# Patient Record
Sex: Female | Born: 1975 | Race: White | Hispanic: No | Marital: Married | Smoking: Never smoker
Health system: Southern US, Community
[De-identification: ages and names within clinical notes are randomized; demographics above are authoritative.]

## PROBLEM LIST (undated history)

## (undated) DIAGNOSIS — F419 Anxiety disorder, unspecified: Secondary | ICD-10-CM

## (undated) HISTORY — PX: TONSILLECTOMY: SUR1361

## (undated) HISTORY — PX: BREAST BIOPSY: SHX20

---

## 2002-07-24 ENCOUNTER — Other Ambulatory Visit: Admission: RE | Admit: 2002-07-24 | Discharge: 2002-07-24 | Payer: Self-pay | Admitting: Obstetrics and Gynecology

## 2003-08-13 ENCOUNTER — Other Ambulatory Visit: Admission: RE | Admit: 2003-08-13 | Discharge: 2003-08-13 | Payer: Self-pay | Admitting: Obstetrics and Gynecology

## 2004-05-22 ENCOUNTER — Inpatient Hospital Stay (HOSPITAL_COMMUNITY): Admission: AD | Admit: 2004-05-22 | Discharge: 2004-06-01 | Payer: Self-pay | Admitting: Obstetrics and Gynecology

## 2004-05-29 ENCOUNTER — Encounter (INDEPENDENT_AMBULATORY_CARE_PROVIDER_SITE_OTHER): Payer: Self-pay | Admitting: Specialist

## 2004-06-02 ENCOUNTER — Encounter: Admission: RE | Admit: 2004-06-02 | Discharge: 2004-07-02 | Payer: Self-pay | Admitting: Obstetrics and Gynecology

## 2004-07-11 ENCOUNTER — Other Ambulatory Visit: Admission: RE | Admit: 2004-07-11 | Discharge: 2004-07-11 | Payer: Self-pay | Admitting: Obstetrics and Gynecology

## 2004-07-11 ENCOUNTER — Encounter: Admission: RE | Admit: 2004-07-11 | Discharge: 2004-08-10 | Payer: Self-pay | Admitting: Obstetrics and Gynecology

## 2005-01-17 ENCOUNTER — Ambulatory Visit: Payer: Self-pay | Admitting: Internal Medicine

## 2005-02-20 ENCOUNTER — Ambulatory Visit: Payer: Self-pay | Admitting: Internal Medicine

## 2005-07-17 ENCOUNTER — Other Ambulatory Visit: Admission: RE | Admit: 2005-07-17 | Discharge: 2005-07-17 | Payer: Self-pay | Admitting: Obstetrics and Gynecology

## 2006-02-23 ENCOUNTER — Ambulatory Visit: Payer: Self-pay | Admitting: Internal Medicine

## 2007-04-30 ENCOUNTER — Inpatient Hospital Stay (HOSPITAL_COMMUNITY): Admission: AD | Admit: 2007-04-30 | Discharge: 2007-05-01 | Payer: Self-pay | Admitting: Obstetrics and Gynecology

## 2007-05-29 ENCOUNTER — Inpatient Hospital Stay (HOSPITAL_COMMUNITY): Admission: AD | Admit: 2007-05-29 | Discharge: 2007-06-02 | Payer: Self-pay | Admitting: Obstetrics & Gynecology

## 2007-06-04 ENCOUNTER — Encounter: Admission: RE | Admit: 2007-06-04 | Discharge: 2007-07-04 | Payer: Self-pay | Admitting: Obstetrics and Gynecology

## 2007-07-05 ENCOUNTER — Encounter: Admission: RE | Admit: 2007-07-05 | Discharge: 2007-07-27 | Payer: Self-pay | Admitting: Obstetrics and Gynecology

## 2008-07-29 ENCOUNTER — Ambulatory Visit: Payer: Self-pay | Admitting: Family Medicine

## 2008-07-29 DIAGNOSIS — J069 Acute upper respiratory infection, unspecified: Secondary | ICD-10-CM | POA: Insufficient documentation

## 2008-07-29 DIAGNOSIS — J309 Allergic rhinitis, unspecified: Secondary | ICD-10-CM | POA: Insufficient documentation

## 2008-09-07 ENCOUNTER — Telehealth: Payer: Self-pay | Admitting: Family Medicine

## 2008-12-24 ENCOUNTER — Ambulatory Visit: Payer: Self-pay | Admitting: Internal Medicine

## 2008-12-31 ENCOUNTER — Ambulatory Visit: Payer: Self-pay | Admitting: Internal Medicine

## 2009-03-10 ENCOUNTER — Ambulatory Visit: Payer: Self-pay | Admitting: Internal Medicine

## 2010-07-26 NOTE — Discharge Summary (Signed)
Mary Martin, Mary Martin             ACCOUNT NO.:  [id number]   MEDICAL RECORD NO.:  [id number]          PATIENT TYPE:  INP   LOCATION:  9124                          FACILITY:  [facility]   PHYSICIAN:  Zenaida Niece, M.D.DATE OF BIRTH:  [birth year]   DATE OF ADMISSION:  05/29/2007  DATE OF DISCHARGE:  06/02/2007                               DISCHARGE SUMMARY   ADMISSION DIAGNOSES:  Intrauterine pregnancy at 38+ weeks with premature  rupture of membranes and previous cesarean section.   DISCHARGE DIAGNOSES:  Intrauterine pregnancy at 38+ weeks with premature  rupture of membranes and previous cesarean section.   PROCEDURES:  On March 19, she had a repeat low-transverse cesarean  section.   HISTORY AND PHYSICAL:  This is a 35 year old white female gravida 2,  para 0-1-0-1 with an EGA of 38+ weeks who presents with complaint of  rupture of membranes at approximately 2100 on March 18.  She had no  contractions.   PRENATAL CARE:  Complicated by prior cesarean section at 32 weeks for  breech presentation.  She was treated with 17 progesterone during this  pregnancy and is scheduled for repeat cesarean section.   PRENATAL LABS:  Blood type is A+ with negative antibody screen, rubella  immune, hepatitis B surface antigen negative, HIV negative, gonorrhea  and chlamydia negative, 1-hour Glucola currently is normal, group B  strep negative, quad screen is normal.   PAST OBSTETRICAL HISTORY:  In 2006, cesarean section at 32-33 weeks for  preterm premature rupture of membranes and breech presentation.  Baby  weighed 4 pounds 14 ounces.   PAST SURGICAL HISTORY:  Cesarean section, wisdom tooth removal, nasal  reconstruction, and tonsillectomy.   PHYSICAL EXAMINATION:  GENERAL:  She is afebrile with stable vital  signs.  HEART:  Fetal heart tracing is reactive.  ABDOMEN:  Gravid with a transverse scar and nontender.  PELVIC:  Cervical exam is deferred, but she is grossly ruptured.   HOSPITAL COURSE:  The patient was admitted on the 19th and Dr.  Senaida Ores took her to the operating room and performed repeat low-  transverse cesarean section.  She delivered a viable female with Apgars  of 8 and 9, weighed 8 pounds 3 ounces.  Estimated blood loss was 650 mL.  Postoperatively, she had no significant complications.  Preoperative  hemoglobin was 11.4, and on postoperative day was 10.2.  On  postoperative day #3, she was felt to be stable enough for discharge  home.  At that time, her incision was healing well and her staples were  removed and Steri-Strips applied.   DISCHARGE INSTRUCTIONS:  Regular diet, pelvic rest, and no strenuous  activity.  Followup is in approximately 2 weeks for an incision check.   MEDICATIONS:  Percocet #40 one to two p.o. q. 4-6 h. p.r.n. pain and  over-the-counter ibuprofen as needed and she is given our discharge  pamphlet.      Zenaida Niece, M.D.  Electronically Signed     TDM/MEDQ  D:  06/16/2007  T:  06/16/2007  Job:  [id number]

## 2010-07-26 NOTE — Op Note (Signed)
Mary Martin, FEW NO.:  [id number]   MEDICAL RECORD NO.:  [id number]          PATIENT TYPE:  INP   LOCATION:  9124                          FACILITY:  [facility]   PHYSICIAN:  Huel Cote, M.D. DATE OF BIRTH:  10/04/1975   DATE OF PROCEDURE:  05/30/2007  DATE OF DISCHARGE:                               OPERATIVE REPORT   PREOPERATIVE DIAGNOSES:  1. Term pregnancy at 38+ weeks.  2. Spontaneous rupture of membranes.  3. Previous C-section.  Desires repeat C-section with declining      vaginal birth after cesarean section.   POSTOPERATIVE DIAGNOSES:  1. Term pregnancy at 38+ weeks.  2. Spontaneous rupture of membranes.  3. Previous C-section.  Desires repeat C-section with declining      vaginal birth after cesarean section.   PROCEDURE:  Repeat low transverse C-section with two-layer closure of  uterus.   SURGEON:  Dr. Huel Cote.   ANESTHESIA:  Spinal.   FINDINGS:  There were normal uterus, tubes and ovaries noted.  There was  a vigorous female infant in vertex presentation.  Apgars were 8 and 9,  weight was 8 pounds 3 ounces.  The placenta was sent to pathology.   ESTIMATED BLOOD LOSS:  650 mL.   URINE OUTPUT:  400 mL clear urine.   IV FLUIDS:  2800 mL LR   PROCEDURE:  The patient was taken to the operating room where spinal  anesthesia was obtained without difficulty.  The patient was then  prepped and draped in normal sterile fashion in dorsal supine position  with a leftward tilt.  A Pfannenstiel's skin incision was then made over  preexisting scar and carried through to the underlying layer of fascia  by sharp dissection and Bovie cautery.  Fascia was then nicked in  midline and the incision was extended laterally with Mayo scissors.  The  inferior aspect was then grasped with Kocher clamps, elevated and  dissected off the underlying rectus muscles.  The superior aspect was  likewise dissected off the rectus muscles.  The rectus muscles  were then  separated in the midline and peritoneal cavity entered bluntly.  The  peritoneal incision was then extended both superiorly and inferiorly  with careful attention to avoid both bowel and bladder.  The Alexis  wound retractor was then placed within the wound and the lower uterine  segment exposed nicely.  The lower uterine segment was then incised in  transverse fashion after the bladder flap pushed away and the cavity  itself entered bluntly.  The infant's head was then delivered  atraumatically, bulb suctioned.  The remainder of the body delivered  without difficulty.  The infant's cord was clamped, cut and the infant  handed to waiting pediatricians.  The placenta was then delivered  expressed manually and the uterus cleared of all clots and debris.  The  uterine incision was then closed in layers, the first a running locked  layer of 0 Vicryl, 0 chromic and the second an imbricating layer of the  same suture.  Good hemostasis at this point was noted and all of the  bladder  flap and all areas were inspected and there was no active  bleeding noted.  The tubes and ovaries were inspected and found to be  normal.  At this point all instruments and sponges were removed and the  Alexis retractor removed from the abdomen.  Again all appeared  hemostatic and the rectus muscles were gaping slightly therefore they  were reapproximated with several interrupted mattress sutures of 0  Vicryl.  The rectus fascia was then closed with 0 Vicryl in a running  fashion and the skin was closed with staples.  Sponge, lap and needle  counts were correct x2 and the patient was taken to the recovery room in  stable condition      Huel Cote, M.D.  Electronically Signed     KR/MEDQ  D:  05/30/2007  T:  05/30/2007  Job:  [id number]

## 2010-07-29 NOTE — Discharge Summary (Signed)
Mary Martin, Mary Martin             ACCOUNT NO.:  [id number]   MEDICAL RECORD NO.:  [id number]          PATIENT TYPE:  INP   LOCATION:  9317                          [facility]ILITY:  [facility]   PHYSICIAN:  Zenaida Niece, M.D.DATE OF BIRTH:  Jan 18, 1976   DATE OF ADMISSION:  05/22/2004  DATE OF DISCHARGE:  06/01/2004                                 DISCHARGE SUMMARY   ADMISSION DIAGNOSES:  1.  Intrauterine pregnancy at 32 weeks.  2.  Pre-term premature rupture of membranes.  3.  Group B strep carrier.  4.  Breech presentation.   DISCHARGE DIAGNOSES:  1.  Intrauterine pregnancy at 32 weeks.  2.  Pre-term premature rupture of membranes.  3.  Group B strep carrier.  4.  Breech presentation.   PROCEDURE:  On May 29, 2004 she had a primary low transverse cesarean  section.   HISTORY OF PRESENT ILLNESS:  This is a 35 year old white female gravida 1,  para 0 with an estimated gestational age of [redacted] weeks, who presents with  complaint of spontaneous rupture of membranes at 0430 on the day of  admission. She had some mild contractions. Prenatal care to that point  complicated by group B strep in her urine and the fact that she conceived on  Clomid.   PAST SURGICAL HISTORY:  Significant for nasal reconstruction, tonsillectomy,  and wisdom teeth.   PRENATAL LABORATORY DATA:  Blood type A positive with negative antibody  screen. RPR non-reactive. Rubella immune. Gonorrhea and Chlamydia negative.  Triple screen normal. One hour Glucola 117 and group B strep is positive in  her urine.   PHYSICAL EXAMINATION:  VITAL SIGNS:  She was afebrile with stable vital  signs. Fetal heart tracing was reactive with contractions every 4 minutes.  ABDOMEN:  Gravid and non-tender.  GENITOURINARY:  Speculum examination confirmed rupture of membranes and  bedside ultrasound revealed breech presentation.   LABORATORY DATA:  Admission white count was 12.7 with a hemoglobin of 12.4,  platelet count of 232,000  and a normal urinalysis.   HOSPITAL COURSE:  The patient was admitted. Placed on Unasyn and given  betamethasone for fetal pulmonary maturation. She was given terbutaline  p.r.n. for the first 24 to 48 hours to give time for fetal pulmonary  maturation. She was continued on Unasyn. On hospital day 4, her Unasyn was  changed to p.o. amoxicillin and Zithromax was added. She continued to be  stable with reactive fetal heart tracing. On the evening of May 28, 2004,  the fetal heart rate baseline was increased slightly. White count had  increased from 12.7 to 16.4. However, this was attributed possibly due to  the steroid injections. On the morning of May 29, 2004, she did have 1  temperature to 100.4 but  that was surrounded by 2 normal temperatures.  Fundus was non-tender and white count was stable at 16.3. She was placed  back on Unasyn and continued on her Zithromax. On the afternoon of May 29, 2004, temperature again rose to 100.2 and she had increasing contractions.  Due to the possibility of chorioamnionitis, now at 33 weeks, we  discussed  possible delivery and the patient agreed to undergo cesarean section, as the  baby was still breech. Thus, on the evening of May 29, 2004, she had a  primary low transverse cesarean section under spinal anesthesia with an  estimated blood loss of 800 cc. The patient had normal anatomy and delivered  a viable female infant with Apgar's of 7 and 8 that weighed 4 pounds 14  ounces and was in the frank breech presentation. Postoperatively, the  patient had no significant complications. She was continued on Unasyn for  approximately 24 hours. Pre-delivery hemoglobin 12.4. Post-delivery 11.5 and  post-delivery white count was 12.1. The patient remained afebrile and on  postoperative day 3, was felt to be stable enough for discharge home. Prior  to discharge, her staples were to be removed and steri-strips applied.   FINAL PATHOLOGY:  Revealed  normal placenta without evidence of  chorioamnionitis.   DISCHARGE INSTRUCTIONS:  1.  Regular diet.  2.  Pelvic rest.  3.  No strenuous activity.  4.  The patient is given our discharge pamphlet.   FOLLOW UP:  In approximately 2 weeks for an incision check.   DISCHARGE MEDICATIONS:  1.  Percocet #40 1 to 2 p.o. q. 4 to 6 hours p.r.n. pain.  2.  Over-the-counter Ibuprofen as needed.      TDM/MEDQ  D:  07/10/2004  T:  07/11/2004  Job:  [id number]

## 2010-07-29 NOTE — Op Note (Signed)
Mary Martin, Mary Martin             ACCOUNT NO.:  [id number]   MEDICAL RECORD NO.:  [id number]          PATIENT TYPE:  INP   LOCATION:  9158                          [facility]ILITY[facility]   PHYSICIAN:  Leighton Roach Meisinger, M.D.DATE OF BIRTH:  Nov 25, 1975   DATE OF PROCEDURE:  05/29/2004  DATE OF DISCHARGE:                                 OPERATIVE REPORT   PREOPERATIVE AND POSTOPERATIVE DIAGNOSES:  Intrauterine pregnancy at 33  weeks, breech presentation, preterm premature rupture of membranes and  chorioamnionitis.   PROCEDURES:  Primary low transverse cesarean section without extensions.   SURGEON:  Zenaida Niece, M.D.   ANESTHESIA:  Spinal.   ESTIMATED BLOOD LOSS:  800 mL.   FINDINGS:  The patient had normal anatomy and delivered a viable female  infant with Apgars of 7 and 8 that weighed 4 pounds 14 ounces and was in the  frank breech presentation.   SPECIMENS:  Placenta to pathology and cultures were done.   PROCEDURE IN DETAIL:  The patient was taken to the operating room and placed  in the sitting position. Dr. Cristela Blue instilled spinal anesthesia and  she was placed in the dorsosupine position with left lateral tilt. Abdomen  was prepped and draped in the usual sterile fashion. A Foley catheter had  previously been placed. The level of her anesthesia was found to be adequate  and abdomen was entered via a standard Pfannenstiel's incision. Bladder  blade was placed and the bladder was pushed inferior. A 4 cm transverse  incision was made in the lower uterine segment. This was extended  bilaterally digitally. The fetal breech was grasped and baby easily  delivered the hips, shoulders, arms and head without difficulty. Mouth and  nares were suctioned and the cord was doubly clamped and cut. The infant was  then handed to the awaiting pediatric team. Cord blood was obtained and the  placenta was manually removed and cultured and sent to pathology. The uterus  was wiped dry  with clean lap pad. Uterine incision was inspected and found  to be free of extensions. Uterine incision was then closed in one layer  being running locking layer with #1 chromic with adequate hemostasis. An  imbricating layer was not placed due to close proximity of the inferior  portion of the incision to the bladder. Bleeding from serosal edges was  controlled with the electrocautery. Tubes and ovaries were inspected and  found to be normal. The subfascial space was then irrigated and made  hemostatic with electrocautery. Fascia was closed in running fashion  starting at both ends and meeting in the middle  with 0 Vicryl. Subcutaneous tissue was then irrigated and made hemostatic  with electrocautery. Skin was closed with staples followed by a sterile  dressing. The patient tolerated the procedure well and was taken to recovery  room in stable condition. Counts were correct x2.      TDM/MEDQ  D:  05/29/2004  T:  05/30/2004  Job:  [id number]

## 2010-12-05 LAB — CBC
HCT: 33.3 — ABNORMAL LOW
Hemoglobin: 11.4 — ABNORMAL LOW
Platelets: 172
RBC: 3.88
RDW: 14
RDW: 14.1
WBC: 10.7 — ABNORMAL HIGH

## 2010-12-05 LAB — RPR: RPR Ser Ql: NONREACTIVE

## 2012-01-22 ENCOUNTER — Ambulatory Visit (INDEPENDENT_AMBULATORY_CARE_PROVIDER_SITE_OTHER): Payer: BC Managed Care – PPO

## 2012-01-22 DIAGNOSIS — Z23 Encounter for immunization: Secondary | ICD-10-CM

## 2012-04-30 ENCOUNTER — Telehealth: Payer: Self-pay | Admitting: Family Medicine

## 2012-04-30 NOTE — Telephone Encounter (Signed)
[facility] Triage Call Report Triage Record Num: [id number] Operator: Roselyn Meier Patient Name: Mary Martin Call Date & Time: 04/29/2012 5:41:25PM Patient Phone: [phone] PCP: Tillman Abide Patient Gender: Female PCP Fax : [phone] Patient DOB: 1975/09/22 Practice Name[facility] Reason for Call: Caller: Diora/Patient; PCP: Tillman Abide (Family Practice); CB#: [phone]; Call regarding Eye redness; Onset 04/29/12 with eye matted (yellow discharge). Pt reports eye is red and is puffy. Pt is afebrile. Triaged patient per Eye: Infection or Irritation Protocol. RX standing orders used for pink eye symptoms. Home care advice and call back parameters given per protocol. RX of Polytrim eye gtts (2 gtts both eyes QID x 5 days) called in to [facility] in [place] [phone] (message left on voicemail) Protocol(s) Used: Eye: Infection or Irritation Recommended Outcome per Protocol: See Provider within 24 hours Override Outcome if Used in Protocol: Provide Home/Self Care RN Reason for Override Outcome: Rx Standing Orders Used. Reason for Outcome: New onset of eye redness, irritation/foreign body sensation or gritty feeling with yellow/green drainage Care Advice: Clean any eye drainage with clean, wet cloth; use warm tap water. Do not use same cloth on unaffected eye. Do not share washcloth with others. ~ 04/29/2012 5:56:25PM Page 1 of 1 [email or web address]

## 2013-04-03 ENCOUNTER — Telehealth: Payer: Self-pay | Admitting: Internal Medicine

## 2013-04-03 MED ORDER — AMOXICILLIN 500 MG PO TABS: 1000.0000 mg | ORAL_TABLET | Freq: Two times a day (BID) | ORAL | Status: DC

## 2013-04-03 NOTE — Telephone Encounter (Signed)
Phone call with mom Daughter had positive strep  She has had bad sore throat Will treat her with amoxil (tends to sinus problems as well)

## 2014-02-11 ENCOUNTER — Ambulatory Visit (INDEPENDENT_AMBULATORY_CARE_PROVIDER_SITE_OTHER): Payer: BC Managed Care – PPO | Admitting: Internal Medicine

## 2014-02-11 ENCOUNTER — Encounter: Payer: Self-pay | Admitting: Internal Medicine

## 2014-02-11 VITALS — BP 104/68 | HR 88 | Temp 98.5°F | Wt 136.8 lb

## 2014-02-11 DIAGNOSIS — J0100 Acute maxillary sinusitis, unspecified: Secondary | ICD-10-CM

## 2014-02-11 DIAGNOSIS — J019 Acute sinusitis, unspecified: Secondary | ICD-10-CM | POA: Insufficient documentation

## 2014-02-11 MED ORDER — AMOXICILLIN 500 MG PO TABS: 1000.0000 mg | ORAL_TABLET | Freq: Two times a day (BID) | ORAL | Status: DC

## 2014-02-11 NOTE — Progress Notes (Signed)
Pre visit review using our clinic review tool, if applicable. No additional management support is needed unless otherwise documented below in the visit note. 

## 2014-02-11 NOTE — Progress Notes (Signed)
   Subjective:    Patient ID: Mary Martin, female    DOB: [birth year], 38 y.o.   MRN: [id number]  HPI Here with daughter She and husband are also sick  Thinks she has a sinus infection Post nasal drip--stuck in throat Head congestion and stuffiness Started 4-5 days ago  No sig cough No fever No SOB No ear pain No sore throat  No current outpatient prescriptions on file prior to visit.   No current facility-administered medications on file prior to visit.    No Known Allergies  No past medical history on file.  No past surgical history on file.  No family history on file.  History   Social History  . Marital Status: Married    Spouse Name: N/A    Number of Children: N/A  . Years of Education: N/A   Occupational History  . Not on file.   Social History Main Topics  . Smoking status: Never Smoker   . Smokeless tobacco: Never Used  . Alcohol Use: No  . Drug Use: No  . Sexual Activity: Not on file   Other Topics Concern  . Not on file   Social History Narrative  . No narrative on file   Review of Systems No rash No N/V Loose stools this weekend---relates to something she ate    Objective:   Physical Exam  Constitutional: She appears well-developed and well-nourished. No distress.  HENT:  Slight pharyngeal injection--no exudate TMs normal Moderate nasal congestion Mild maxillary tenderness  Neck: Normal range of motion. Neck supple. No thyromegaly present.  Pulmonary/Chest: Effort normal and breath sounds normal. No respiratory distress. She has no wheezes. She has no rales.  Lymphadenopathy:    She has no cervical adenopathy.  Skin: No rash noted.          Assessment & Plan:

## 2014-02-11 NOTE — Assessment & Plan Note (Signed)
Sounds like this is just viral Discussed supportive care Daughter with strep also Will give Rx for amoxil in she worsens or throat starts worsening

## 2014-06-24 ENCOUNTER — Ambulatory Visit (INDEPENDENT_AMBULATORY_CARE_PROVIDER_SITE_OTHER): Payer: BC Managed Care – PPO | Admitting: Internal Medicine

## 2014-06-24 ENCOUNTER — Encounter: Payer: Self-pay | Admitting: Internal Medicine

## 2014-06-24 VITALS — BP 102/64 | HR 64 | Temp 98.4°F | Wt 142.2 lb

## 2014-06-24 DIAGNOSIS — Z0000 Encounter for general adult medical examination without abnormal findings: Secondary | ICD-10-CM | POA: Insufficient documentation

## 2014-06-24 DIAGNOSIS — Z23 Encounter for immunization: Secondary | ICD-10-CM

## 2014-06-24 DIAGNOSIS — R5383 Other fatigue: Secondary | ICD-10-CM

## 2014-06-24 LAB — CBC WITH DIFFERENTIAL/PLATELET
BASOS ABS: 0 10*3/uL (ref 0.0–0.1)
Basophils Relative: 0.5 % (ref 0.0–3.0)
EOS PCT: 1.2 % (ref 0.0–5.0)
Eosinophils Absolute: 0.1 10*3/uL (ref 0.0–0.7)
HCT: 40.7 % (ref 36.0–46.0)
HEMOGLOBIN: 13.8 g/dL (ref 12.0–15.0)
LYMPHS PCT: 26.4 % (ref 12.0–46.0)
Lymphs Abs: 2 10*3/uL (ref 0.7–4.0)
MCHC: 33.9 g/dL (ref 30.0–36.0)
MCV: 85.9 fl (ref 78.0–100.0)
MONOS PCT: 5.3 % (ref 3.0–12.0)
Monocytes Absolute: 0.4 10*3/uL (ref 0.1–1.0)
NEUTROS ABS: 4.9 10*3/uL (ref 1.4–7.7)
Neutrophils Relative %: 66.6 % (ref 43.0–77.0)
Platelets: 312 10*3/uL (ref 150.0–400.0)
RBC: 4.74 Mil/uL (ref 3.87–5.11)
RDW: 13.4 % (ref 11.5–15.5)
WBC: 7.4 10*3/uL (ref 4.0–10.5)

## 2014-06-24 LAB — COMPREHENSIVE METABOLIC PANEL
ALK PHOS: 40 U/L (ref 39–117)
ALT: 14 U/L (ref 0–35)
AST: 18 U/L (ref 0–37)
Albumin: 4.2 g/dL (ref 3.5–5.2)
BILIRUBIN TOTAL: 0.7 mg/dL (ref 0.2–1.2)
BUN: 11 mg/dL (ref 6–23)
CO2: 28 meq/L (ref 19–32)
CREATININE: 0.65 mg/dL (ref 0.40–1.20)
Calcium: 9.5 mg/dL (ref 8.4–10.5)
Chloride: 101 mEq/L (ref 96–112)
GFR: 108.43 mL/min (ref >60.00)
GLUCOSE: 85 mg/dL (ref 70–99)
Potassium: 3.9 mEq/L (ref 3.5–5.1)
SODIUM: 136 meq/L (ref 135–145)
TOTAL PROTEIN: 7.6 g/dL (ref 6.0–8.3)

## 2014-06-24 LAB — LIPID PANEL
CHOLESTEROL: 217 mg/dL — AB (ref 0–200)
HDL: 86.6 mg/dL (ref >39.00)
LDL Cholesterol: 113 mg/dL — ABNORMAL HIGH (ref 0–99)
NONHDL: 130.4
TRIGLYCERIDES: 89 mg/dL (ref 0.0–149.0)
Total CHOL/HDL Ratio: 3
VLDL: 17.8 mg/dL (ref 0.0–40.0)

## 2014-06-24 LAB — T4, FREE: Free T4: 0.83 ng/dL (ref 0.60–1.60)

## 2014-06-24 NOTE — Addendum Note (Signed)
Addended by: Sydell AxonLAWS, Amreen Raczkowski C on: 06/24/2014 02:30 PM   Modules accepted: Orders

## 2014-06-24 NOTE — Progress Notes (Signed)
Subjective:    Patient ID: Mary Martin, female    DOB: August 28, 1975, 39 y.o.   MRN: [id number]  HPI Here essentially for physical Concerned about gaining about 15# in the past 2 years No set exercise schedule Does have a fit bit--- keeping track   Feels tired Relates this to working a lot--etc Spends time in car--leaves so early to go to her school (with her kids) Discussed her schedule, etc  Current Outpatient Prescriptions on File Prior to Visit  Medication Sig Dispense Refill  . norgestimate-ethinyl estradiol (ORTHO-CYCLEN,SPRINTEC,PREVIFEM) 0.25-35 MG-MCG tablet Take 1 tablet by mouth daily.     No current facility-administered medications on file prior to visit.    No Known Allergies  No past medical history on file.  No past surgical history on file.  Family History  Problem Relation Age of Onset  . Diabetes Paternal Grandmother   . Heart disease Neg Hx   . Cancer Neg Hx     History   Social History  . Marital Status: Married    Spouse Name: N/A  . Number of Children: 2  . Years of Education: N/A   Occupational History  . Grade school teacher    Social History Main Topics  . Smoking status: Never Smoker   . Smokeless tobacco: Never Used  . Alcohol Use: No  . Drug Use: No  . Sexual Activity: Not on file   Other Topics Concern  . Not on file   Social History Narrative   Review of Systems  Constitutional: Positive for fatigue and unexpected weight change.  HENT: Negative for dental problem and hearing loss.   Eyes: Negative for visual disturbance.       Needs exam  Respiratory: Negative for cough, chest tightness and shortness of breath.        Clears throat a lot  Cardiovascular: Negative for chest pain, palpitations and leg swelling.  Gastrointestinal:       No sig heartburn  Endocrine: Positive for polydipsia and polyuria.       Drinks a lot of water--- so goes a lot  Genitourinary: Positive for frequency. Negative for dysuria.    Musculoskeletal: Negative for joint swelling and arthralgias.  Skin: Negative for rash.  Allergic/Immunologic: Negative for environmental allergies and immunocompromised state.  Neurological: Positive for headaches. Negative for dizziness, syncope and light-headedness.  Hematological: Negative for adenopathy. Does not bruise/bleed easily.  Psychiatric/Behavioral: Negative for sleep disturbance and dysphoric mood.       Does have some premenstrual emotional times       Objective:   Physical Exam  Constitutional: She is oriented to person, place, and time. She appears well-developed and well-nourished. No distress.  HENT:  Head: Normocephalic and atraumatic.  Right Ear: External ear normal.  Left Ear: External ear normal.  Mouth/Throat: Oropharynx is clear and moist. No oropharyngeal exudate.  Eyes: Conjunctivae and EOM are normal. Pupils are equal, round, and reactive to light.  Neck: Normal range of motion. Neck supple. No thyromegaly present.  Cardiovascular: Normal rate, regular rhythm, normal heart sounds and intact distal pulses.  Exam reveals no gallop.   No murmur heard. Pulmonary/Chest: Effort normal and breath sounds normal. No respiratory distress. She has no wheezes. She has no rales.  Abdominal: Soft. There is no tenderness.  Musculoskeletal: She exhibits no edema or tenderness.  Lymphadenopathy:    She has no cervical adenopathy.  Neurological: She is alert and oriented to person, place, and time.  Skin: No rash noted.  No erythema.  Psychiatric: She has a normal mood and affect. Her behavior is normal.          Assessment & Plan:

## 2014-06-24 NOTE — Patient Instructions (Signed)
DASH Eating Plan °DASH stands for "Dietary Approaches to Stop Hypertension." The DASH eating plan is a healthy eating plan that has been shown to reduce high blood pressure (hypertension). Additional health benefits may include reducing the risk of type 2 diabetes mellitus, heart disease, and stroke. The DASH eating plan may also help with weight loss. °WHAT DO I NEED TO KNOW ABOUT THE DASH EATING PLAN? °For the DASH eating plan, you will follow these general guidelines: °· Choose foods with a percent daily value for sodium of less than 5% (as listed on the food label). °· Use salt-free seasonings or herbs instead of table salt or sea salt. °· Check with your health care provider or pharmacist before using salt substitutes. °· Eat lower-sodium products, often labeled as "lower sodium" or "no salt added." °· Eat fresh foods. °· Eat more vegetables, fruits, and low-fat dairy products. °· Choose whole grains. Look for the word "whole" as the first word in the ingredient list. °· Choose fish and skinless chicken or turkey more often than red meat. Limit fish, poultry, and meat to 6 oz (170 g) each day. °· Limit sweets, desserts, sugars, and sugary drinks. °· Choose heart-healthy fats. °· Limit cheese to 1 oz (28 g) per day. °· Eat more home-cooked food and less restaurant, buffet, and fast food. °· Limit fried foods. °· Cook foods using methods other than frying. °· Limit canned vegetables. If you do use them, rinse them well to decrease the sodium. °· When eating at a restaurant, ask that your food be prepared with less salt, or no salt if possible. °WHAT FOODS CAN I EAT? °Seek help from a dietitian for individual calorie needs. °Grains °Whole grain or whole wheat bread. Brown rice. Whole grain or whole wheat pasta. Quinoa, bulgur, and whole grain cereals. Low-sodium cereals. Corn or whole wheat flour tortillas. Whole grain cornbread. Whole grain crackers. Low-sodium crackers. °Vegetables °Fresh or frozen vegetables  (raw, steamed, roasted, or grilled). Low-sodium or reduced-sodium tomato and vegetable juices. Low-sodium or reduced-sodium tomato sauce and paste. Low-sodium or reduced-sodium canned vegetables.  °Fruits °All fresh, canned (in natural juice), or frozen fruits. °Meat and Other Protein Products °Ground beef (85% or leaner), grass-fed beef, or beef trimmed of fat. Skinless chicken or turkey. Ground chicken or turkey. Pork trimmed of fat. All fish and seafood. Eggs. Dried beans, peas, or lentils. Unsalted nuts and seeds. Unsalted canned beans. °Dairy °Low-fat dairy products, such as skim or 1% milk, 2% or reduced-fat cheeses, low-fat ricotta or cottage cheese, or plain low-fat yogurt. Low-sodium or reduced-sodium cheeses. °Fats and Oils °Tub margarines without trans fats. Light or reduced-fat mayonnaise and salad dressings (reduced sodium). Avocado. Safflower, olive, or canola oils. Natural peanut or almond butter. °Other °Unsalted popcorn and pretzels. °The items listed above may not be a complete list of recommended foods or beverages. Contact your dietitian for more options. °WHAT FOODS ARE NOT RECOMMENDED? °Grains °White bread. White pasta. White rice. Refined cornbread. Bagels and croissants. Crackers that contain trans fat. °Vegetables °Creamed or fried vegetables. Vegetables in a cheese sauce. Regular canned vegetables. Regular canned tomato sauce and paste. Regular tomato and vegetable juices. °Fruits °Dried fruits. Canned fruit in light or heavy syrup. Fruit juice. °Meat and Other Protein Products °Fatty cuts of meat. Ribs, chicken wings, bacon, sausage, bologna, salami, chitterlings, fatback, hot dogs, bratwurst, and packaged luncheon meats. Salted nuts and seeds. Canned beans with salt. °Dairy °Whole or 2% milk, cream, half-and-half, and cream cheese. Whole-fat or sweetened yogurt. Full-fat   cheeses or blue cheese. Nondairy creamers and whipped toppings. Processed cheese, cheese spreads, or cheese  curds. °Condiments °Onion and garlic salt, seasoned salt, table salt, and sea salt. Canned and packaged gravies. Worcestershire sauce. Tartar sauce. Barbecue sauce. Teriyaki sauce. Soy sauce, including reduced sodium. Steak sauce. Fish sauce. Oyster sauce. Cocktail sauce. Horseradish. Ketchup and mustard. Meat flavorings and tenderizers. Bouillon cubes. Hot sauce. Tabasco sauce. Marinades. Taco seasonings. Relishes. °Fats and Oils °Butter, stick margarine, lard, shortening, ghee, and bacon fat. Coconut, palm kernel, or palm oils. Regular salad dressings. °Other °Pickles and olives. Salted popcorn and pretzels. °The items listed above may not be a complete list of foods and beverages to avoid. Contact your dietitian for more information. °WHERE CAN I FIND MORE INFORMATION? °National Heart, Lung, and [employer] Institute: www.nhlbi.nih.gov/health/health-topics/topics/dash/ °Document Released: 02/16/2011 Document Revised: 07/14/2013 Document Reviewed: 01/01/2013 °ExitCare® Patient Information ©2015 [employer]. This information is not intended to replace advice given to you by your health care provider. Make sure you discuss any questions you have with your health care provider. ° °

## 2014-06-24 NOTE — Assessment & Plan Note (Signed)
Seems like it is overscheduling, etc that is common Will check labs

## 2014-06-24 NOTE — Assessment & Plan Note (Signed)
Seems to be healthy Discussed fitness Tdap today

## 2014-06-24 NOTE — Progress Notes (Signed)
Pre visit review using our clinic review tool, if applicable. No additional management support is needed unless otherwise documented below in the visit note. 

## 2014-12-25 ENCOUNTER — Encounter: Payer: Self-pay | Admitting: Family Medicine

## 2014-12-25 ENCOUNTER — Ambulatory Visit (INDEPENDENT_AMBULATORY_CARE_PROVIDER_SITE_OTHER): Payer: BC Managed Care – PPO | Admitting: Family Medicine

## 2014-12-25 VITALS — BP 120/74 | HR 88 | Temp 98.1°F | Wt 143.5 lb

## 2014-12-25 DIAGNOSIS — B9789 Other viral agents as the cause of diseases classified elsewhere: Secondary | ICD-10-CM

## 2014-12-25 DIAGNOSIS — J029 Acute pharyngitis, unspecified: Secondary | ICD-10-CM | POA: Diagnosis not present

## 2014-12-25 DIAGNOSIS — J069 Acute upper respiratory infection, unspecified: Secondary | ICD-10-CM | POA: Insufficient documentation

## 2014-12-25 DIAGNOSIS — J028 Acute pharyngitis due to other specified organisms: Secondary | ICD-10-CM

## 2014-12-25 LAB — POCT RAPID STREP A (OFFICE): Rapid Strep A Screen: NEGATIVE

## 2014-12-25 MED ORDER — AMOXICILLIN 500 MG PO CAPS: 500.0000 mg | ORAL_CAPSULE | Freq: Three times a day (TID) | ORAL | Status: DC

## 2014-12-25 NOTE — Progress Notes (Signed)
Pre visit review using our clinic review tool, if applicable. No additional management support is needed unless otherwise documented below in the visit note. 

## 2014-12-25 NOTE — Patient Instructions (Signed)
I think you have an early viral upper respiratory infection  Rest Fluids Salt water gargling -when you can  Tylenol or ibuprofen for pain or fever (helps with fever)  Voice rest helps  This may develop into nasal or chest symptoms   If sore throat worsens and you run fever or develop a rash - fill the px for amoxicillin and start it    Update if not starting to improve in a week or if worsening

## 2014-12-25 NOTE — Progress Notes (Signed)
Subjective:    Patient ID: Mary Martin, female    DOB: [birth year], 39 y.o.   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 Preventative health care 06/24/2014  . Fatigue 06/24/2014   No past medical history on file. No past surgical history on file. Social History  Substance Use Topics  . Smoking status: Never Smoker   . Smokeless tobacco: Never Used  . Alcohol Use: No   Family History  Problem Relation Age of Onset  . Diabetes Paternal Grandmother   . Heart disease Neg Hx   . Cancer Neg Hx    No Known Allergies Current Outpatient Prescriptions on File Prior to Visit  Medication Sig Dispense Refill  . Docusate Calcium (STOOL SOFTENER PO) Take by mouth daily.    . Multiple Vitamin (MULTIVITAMIN) tablet Take 1 tablet by mouth daily.    . norgestimate-ethinyl estradiol (ORTHO-CYCLEN,SPRINTEC,PREVIFEM) 0.25-35 MG-MCG tablet Take 1 tablet by mouth daily.    . Probiotic Product (PROBIOTIC DAILY PO) Take by mouth daily.     No current facility-administered medications on file prior to visit.     Review of Systems Review of Systems  Constitutional: Negative for fever, appetite change, and unexpected weight change.  ENT pos for ST and hoarseness/ neg for nasal symptoms or sinus pain  Eyes: Negative for pain and visual disturbance.  Respiratory: Negative for cough and shortness of breath.  pos for throat clearing/almost cough Cardiovascular: Negative for cp  or palpitations    Gastrointestinal: Negative for nausea, diarrhea and constipation.  Genitourinary: Negative for urgency and frequency.  Skin: Negative for pallor or rash   Neurological: Negative for weakness, light-headedness, numbness and headaches.  Hematological: Negative for adenopathy. Does not bruise/bleed easily.  Psychiatric/Behavioral: Negative for dysphoric mood. The patient is not nervous/anxious.         Objective:   Physical Exam  Constitutional: She appears well-developed and well-nourished. No distress.  HENT:  Head: Normocephalic and atraumatic.  Right Ear: External ear normal.  Left Ear: External ear normal.  Mouth/Throat: Oropharynx is clear and moist. No oropharyngeal exudate.  Nares are boggy No sinus tenderness Clear post nasal drip /mild    Eyes: Conjunctivae and EOM are normal. Pupils are equal, round, and reactive to light. Right eye exhibits no discharge. Left eye exhibits no discharge.  Neck: Normal range of motion. Neck supple.  Cardiovascular: Normal rate and normal heart sounds.   Pulmonary/Chest: Effort normal and breath sounds normal. No respiratory distress. She has no wheezes. She has no rales. She exhibits no tenderness.  Lymphadenopathy:    She has no cervical adenopathy.  Neurological: She is alert.  Skin: Skin is warm and dry. No rash noted.  Psychiatric: She has a normal mood and affect.  Assessment & Plan:   Problem List Items Addressed This Visit      Respiratory   Sore throat (viral)    Suspect very early uri  Disc symptomatic care - see instructions on AVS  Update if not starting to improve in a week or if worsening    Neg RST- but if worsening of ST over the coming days or fever -will fill px for amoxicillin       Other Visit Diagnoses    Sore throat    -  Primary    Relevant Orders    Rapid Strep A (Completed)

## 2014-12-27 NOTE — Assessment & Plan Note (Signed)
Suspect very early uri  Disc symptomatic care - see instructions on AVS  Update if not starting to improve in a week or if worsening    Neg RST- but if worsening of ST over the coming days or fever -will fill px for amoxicillin

## 2015-02-12 ENCOUNTER — Other Ambulatory Visit: Payer: Self-pay | Admitting: Internal Medicine

## 2015-02-12 ENCOUNTER — Ambulatory Visit (INDEPENDENT_AMBULATORY_CARE_PROVIDER_SITE_OTHER): Payer: BC Managed Care – PPO | Admitting: Internal Medicine

## 2015-02-12 ENCOUNTER — Encounter: Payer: Self-pay | Admitting: Internal Medicine

## 2015-02-12 VITALS — BP 118/70 | HR 97 | Temp 98.3°F | Ht 62.0 in | Wt 146.0 lb

## 2015-02-12 DIAGNOSIS — K120 Recurrent oral aphthae: Secondary | ICD-10-CM

## 2015-02-12 DIAGNOSIS — J0100 Acute maxillary sinusitis, unspecified: Secondary | ICD-10-CM | POA: Diagnosis not present

## 2015-02-12 MED ORDER — AMOXICILLIN 500 MG PO CAPS: 500.0000 mg | ORAL_CAPSULE | Freq: Three times a day (TID) | ORAL | Status: DC

## 2015-02-12 MED ORDER — FLUCONAZOLE 150 MG PO TABS: 150.0000 mg | ORAL_TABLET | Freq: Once | ORAL | Status: DC

## 2015-02-12 NOTE — Patient Instructions (Signed)
Plain Mucinex (NOT D) for thick secretions ;force NON dairy fluids .   Nasal cleansing in the shower as discussed with lather of mild shampoo.After 10 seconds wash off lather while  exhaling through nostrils. Make sure that all residual soap is removed to prevent irritation.  Flonase OR Nasacort AQ 1 spray in each nostril twice a day as needed. Use the "crossover" technique into opposite nostril spraying toward opposite ear @ 45 degree angle, not straight up into nostril.  Plain Allegra (NOT D )  160 daily , Loratidine 10 mg , OR Zyrtec 10 mg @ bedtime  as needed for itchy eyes & sneezing.Zicam Melts or Zinc lozenges as per package label for sore throat .  Complementary options to boost immunity include  vitamin C 2000 mg daily; & Echinacea for 4-7 days.  Please take a probiotic , Florastor OR Align, every day if the bowels are loose. This will replace the normal bacteria which  are necessary for formation of normal stool and processing of food.

## 2015-02-12 NOTE — Progress Notes (Signed)
Pre visit review using our clinic review tool, if applicable. No additional management support is needed unless otherwise documented below in the visit note. 

## 2015-02-12 NOTE — Progress Notes (Signed)
   Subjective:    Patient ID: Mary Martin, female    DOB: [birth year], 39 y.o.   MRN: [id number]  HPI Her symptoms began 02/07/15 as head congestion. She also began to develop painful lesions intraorally. This is an intermittent phenomena.She has maxillary sinus area pain and green nasal discharge. Cough is minor and productive of some white sputum.  On 11/30 and 12/2 she's had loose stool. She felt feverish today.  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 She has a 38-year-old 38 year old.    Review of Systems Frontal headache, , dental pain, sore throat , otic pain or otic discharge denied. No chills or sweats. Extrinsic symptoms of itchy, watery eyes, sneezing, or angioedema are denied. There is no  Wheezing or  paroxysmal nocturnal dyspnea.     Objective:   Physical Exam  Pertinent or positive findings include: She has 3 aphthous ulcers intraorally. There is mild nasal erythema. General appearance:Adequately nourished; no acute distress or increased work of breathing is present.    Lymphatic: No  lymphadenopathy about the head, neck, or axilla .  Eyes: No conjunctival inflammation or lid edema is present. There is no scleral icterus.  Ears:  External ear exam shows no significant lesions or deformities.  Otoscopic examination reveals clear canals, tympanic membranes are intact bilaterally without bulging, retraction, inflammation or discharge.  Nose:  External nasal examination shows no deformity or inflammation. No septal dislocation or deviation.No obstruction to airflow.   Oral exam: Dental hygiene is good; lips and gums are healthy appearing.There is no oropharyngeal erythema or exudate .  Neck:  No deformities, thyromegaly, masses, or tenderness noted.   Supple with full range of motion without pain.   Heart:  Normal rate and regular rhythm. S1 and S2 normal without gallop, murmur, click, rub or other extra sounds.   Lungs:Chest clear to auscultation; no wheezes,  rhonchi,rales ,or rubs present.  Extremities:  No cyanosis, edema, or clubbing  noted    Skin: Warm & dry w/o tenting or jaundice. No significant lesions or rash.     Assessment & Plan:  #1 rhinosinusitis without significant bronchitis  Plan: Nasal hygiene interventions discussed. See prescription medications

## 2016-07-26 ENCOUNTER — Encounter: Payer: Self-pay | Admitting: Family Medicine

## 2016-07-26 ENCOUNTER — Ambulatory Visit (INDEPENDENT_AMBULATORY_CARE_PROVIDER_SITE_OTHER): Payer: BC Managed Care – PPO | Admitting: Family Medicine

## 2016-07-26 DIAGNOSIS — J069 Acute upper respiratory infection, unspecified: Secondary | ICD-10-CM | POA: Diagnosis not present

## 2016-07-26 MED ORDER — FLUTICASONE PROPIONATE 50 MCG/ACT NA SUSP: 2.0000 | Freq: Every day | NASAL | 6 refills | Status: DC

## 2016-07-26 MED ORDER — FLUCONAZOLE 150 MG PO TABS: 150.0000 mg | ORAL_TABLET | Freq: Once | ORAL | 0 refills | Status: AC

## 2016-07-26 MED ORDER — AMOXICILLIN-POT CLAVULANATE 875-125 MG PO TABS: 1.0000 | ORAL_TABLET | Freq: Two times a day (BID) | ORAL | 0 refills | Status: DC

## 2016-07-26 MED ORDER — BENZONATATE 200 MG PO CAPS: 200.0000 mg | ORAL_CAPSULE | Freq: Two times a day (BID) | ORAL | 0 refills | Status: DC | PRN

## 2016-07-26 NOTE — Assessment & Plan Note (Signed)
Symptoms most consistent with viral URI. Nothing focal to indicate a bacterial illness and duration argues against bacterial sinusitis. Discussed this with the patient. Advised on supportive care with oral antihistamines and Flonase. Tessalon for cough. Given history of sinus infections and high did provide her with a paper prescription for an antibiotic to fill in 2-3 days if her symptoms do not improve. Advised that if her symptoms worsen or she develops any of the return precautions she be evaluated prior to filling the antibiotic. She is provided with a Diflucan prescription as well if she were to fill the antibiotic. She's given return precautions.

## 2016-07-26 NOTE — Progress Notes (Signed)
  Marikay AlarEric Sharry Beining, MD Phone: [phone]  Mary ApaLeslie M Martin is a 41 y.o. female who presents today for same-day visit.  Patient reports onset of symptoms 5 days ago. Has had some sore throat and postnasal drip with sinus congestion. She is not blowing much out of her nose and when she does it is clear or slightly blood-tinged. She notes spitting up minimal blood-tinged mucus in the mornings though not during the day.. She started coughing yesterday that was nonproductive. No shortness of breath. Some postnasal drip. No fevers. Notes her frontal teeth and maxillary sinuses hurt. She does report she is prone to sinus infections. She tried an antihistamine over-the-counter and NyQuil.  ROS see history of present illness  Objective  Physical Exam Vitals:   07/26/16 1312  BP: 120/74  Pulse: 86  Temp: 99 F (37.2 C)    BP Readings from Last 3 Encounters:  07/26/16 120/74  02/12/15 118/70  12/25/14 120/74   Wt Readings from Last 3 Encounters:  07/26/16 143 lb 12.8 oz (65.2 kg)  02/12/15 146 lb (66.2 kg)  12/25/14 143 lb 8 oz (65.1 kg)    Physical Exam  Constitutional: No distress.  HENT:  Head: Normocephalic and atraumatic.  Mouth/Throat: Oropharynx is clear and moist. No oropharyngeal exudate.  Normal TMs, mild tenderness of maxillary sinuses bilaterally  Eyes: Conjunctivae are normal. Pupils are equal, round, and reactive to light.  Neck: Neck supple.  Cardiovascular: Normal rate, regular rhythm and normal heart sounds.   Pulmonary/Chest: Effort normal and breath sounds normal.  Lymphadenopathy:    She has no cervical adenopathy.  Neurological: She is alert. Gait normal.  Skin: Skin is warm and dry. She is not diaphoretic.     Assessment/Plan: Please see individual problem list.  Viral upper respiratory infection Symptoms most consistent with viral URI. Nothing focal to indicate a bacterial illness and duration argues against bacterial sinusitis. Discussed this with the  patient. Advised on supportive care with oral antihistamines and Flonase. Tessalon for cough. Given history of sinus infections and high did provide her with a paper prescription for an antibiotic to fill in 2-3 days if her symptoms do not improve. Advised that if her symptoms worsen or she develops any of the return precautions she be evaluated prior to filling the antibiotic. She is provided with a Diflucan prescription as well if she were to fill the antibiotic. She's given return precautions.   No orders of the defined types were placed in this encounter.   Meds ordered this encounter  Medications  . fluticasone (FLONASE) 50 MCG/ACT nasal spray    Sig: Place 2 sprays into both nostrils daily.    Dispense:  16 g    Refill:  6  . fluconazole (DIFLUCAN) 150 MG tablet    Sig: Take 1 tablet (150 mg total) by mouth once.    Dispense:  1 tablet    Refill:  0  . amoxicillin-clavulanate (AUGMENTIN) 875-125 MG tablet    Sig: Take 1 tablet by mouth 2 (two) times daily.    Dispense:  14 tablet    Refill:  0  . benzonatate (TESSALON) 200 MG capsule    Sig: Take 1 capsule (200 mg total) by mouth 2 (two) times daily as needed for cough.    Dispense:  20 capsule    Refill:  0    Marikay AlarEric Denielle Bayard, MD [facility]

## 2016-07-26 NOTE — Patient Instructions (Signed)
Nice to see you. Your symptoms are likely related to a viral illness at this point. You would benefit from symptomatic treatment with your oral antihistamine and Flonase. You may take Tylenol or ibuprofen for any discomfort. I would watch this for the next 2-3 days and if you are not improving by that time you can fill the antibiotic. If you worsen or you develop trouble breathing or fevers or cough productive of blood I recommend evaluation prior to filling the antibiotic.

## 2016-08-10 ENCOUNTER — Encounter: Payer: Self-pay | Admitting: Physician Assistant

## 2016-08-10 ENCOUNTER — Ambulatory Visit (INDEPENDENT_AMBULATORY_CARE_PROVIDER_SITE_OTHER): Payer: BC Managed Care – PPO | Admitting: Physician Assistant

## 2016-08-10 VITALS — BP 120/80 | HR 76 | Temp 98.7°F | Ht 62.0 in | Wt 144.4 lb

## 2016-08-10 DIAGNOSIS — N3090 Cystitis, unspecified without hematuria: Secondary | ICD-10-CM | POA: Diagnosis not present

## 2016-08-10 LAB — POCT URINALYSIS DIPSTICK
Bilirubin, UA: NEGATIVE
Blood, UA: 200
Glucose, UA: NEGATIVE
KETONES UA: NEGATIVE
Nitrite, UA: NEGATIVE
PH UA: 6 (ref 5.0–8.0)
PROTEIN UA: NEGATIVE
Spec Grav, UA: 1.01 (ref 1.010–1.025)
UROBILINOGEN UA: 0.2 U/dL

## 2016-08-10 MED ORDER — FLUCONAZOLE 150 MG PO TABS: 150.0000 mg | ORAL_TABLET | Freq: Once | ORAL | 0 refills | Status: AC

## 2016-08-10 MED ORDER — CIPROFLOXACIN HCL 500 MG PO TABS: 500.0000 mg | ORAL_TABLET | Freq: Two times a day (BID) | ORAL | 0 refills | Status: DC

## 2016-08-10 NOTE — Patient Instructions (Addendum)
Start the antibiotic, Ciprofloxacin today.  Push fluids, and stay hydrated.  If you develop fever, chills, or worsening back pain, please follow-up with PCP or us.

## 2016-08-10 NOTE — Progress Notes (Signed)
Mary Martin is a 41 y.o. female here for  I acted as a Neurosurgeon for [employer], PA-C Corky Mull, LPN  History of Present Illness:   Chief Complaint  Patient presents with  . Urinary Frequency  . Dysuria    Dysuria   This is a new problem. Episode onset: started on Monday. The problem occurs every urination. The problem has been gradually worsening. The quality of the pain is described as burning and aching. The pain is at a severity of 6/10. The pain is moderate. There has been no fever. She is sexually active. There is no history of pyelonephritis. Associated symptoms include chills, flank pain, frequency, hematuria, hesitancy and urgency. Pertinent negatives include no discharge, nausea, possible pregnancy, sweats or vomiting. Associated symptoms comments: Left flank pain last night, none today.. She has tried NSAIDs (Took Aleve last night) for the symptoms. The treatment provided no relief. There is no history of kidney stones or recurrent UTIs.   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 She also has a history of antibiotic-induced yeast infections.  PMHx, SurgHx, SocialHx, Medications, and Allergies were reviewed in the Visit Navigator and updated as appropriate.  Current Medications:   Current Outpatient Prescriptions:  .  fexofenadine (ALLEGRA) 180 MG tablet, Take by mouth., Disp: , Rfl:  .  naproxen sodium (ALEVE) 220 MG tablet, Take 220 mg by mouth as needed., Disp: , Rfl:  .  norgestimate-ethinyl estradiol (ORTHO-CYCLEN,SPRINTEC,PREVIFEM) 0.25-35 MG-MCG tablet, Take 1 tablet by mouth daily., Disp: , Rfl:  .  benzonatate (TESSALON) 200 MG capsule, Take 1 capsule (200 mg total) by mouth 2 (two) times daily as needed for cough. (Patient not taking: Reported on 08/10/2016), Disp: 20 capsule, Rfl: 0 .  ciprofloxacin (CIPRO) 500 MG tablet, Take 1 tablet (500 mg total) by mouth 2 (two) times daily., Disp: 14 tablet, Rfl:  0 .  fluconazole (DIFLUCAN) 150 MG tablet, Take 1 tablet (150 mg total) by mouth once., Disp: 1 tablet, Rfl: 0 .  fluticasone (FLONASE) 50 MCG/ACT nasal spray, Place 2 sprays into both nostrils daily. (Patient not taking: Reported on 08/10/2016), Disp: 16 g, Rfl: 6   Review of Systems:   Review of Systems  Constitutional: Positive for chills.  Gastrointestinal: Negative for nausea and vomiting.  Genitourinary: Positive for dysuria, flank pain, frequency, hematuria, hesitancy and urgency.    Vitals:   Vitals:   08/10/16 1544  BP: 120/80  Pulse: 76  Temp: 98.7 F (37.1 C)  TempSrc: Oral  SpO2: 100%  Weight: 144 lb 6.1 oz (65.5 kg)  Height: 5\' 2"  (1.575 m)     Body mass index is 26.41 kg/m.  Physical Exam:   Physical Exam  Constitutional: She appears well-developed. She is cooperative.  Non-toxic appearance. She does not have a sickly appearance. She does not appear ill. No distress.  Cardiovascular: Normal rate, regular rhythm, S1 normal, S2 normal, normal heart sounds and normal pulses.   No LE edema  Pulmonary/Chest: Effort normal and breath sounds normal.  Abdominal: Bowel sounds are normal. There is tenderness in the suprapubic area. There is CVA tenderness (mild on L side).  Neurological: She is alert.  Nursing note and vitals reviewed.   Results for orders placed or performed in visit on 08/10/16  POCT urinalysis dipstick  Result Value Ref Range   Color, UA Yellow    Clarity, UA Clear    Glucose, UA Negative  Bilirubin, UA Negative    Ketones, UA Negative    Spec Grav, UA 1.010 1.010 - 1.025   Blood, UA 200 Ery/uL    pH, UA 6.0 5.0 - 8.0   Protein, UA Negative    Urobilinogen, UA 0.2 0.2 or 1.0 E.U./dL   Nitrite, UA Negative    Leukocytes, UA Moderate (2+) (A) Negative    Assessment and Plan:    Verlon AuLeslie was seen today for urinary frequency and dysuria.  Diagnoses and all orders for this visit:  Cystitis -     POCT urinalysis dipstick -     CBC with  Differential/Platelet -     Comprehensive metabolic panel  Frequency of urination -     POCT urinalysis dipstick  Other orders -     ciprofloxacin (CIPRO) 500 MG tablet; Take 1 tablet (500 mg total) by mouth 2 (two) times daily. -     fluconazole (DIFLUCAN) 150 MG tablet; Take 1 tablet (150 mg total) by mouth once.   UA and symptoms consistent with cystitis. I will obtain labs -- CMP and CBC to assess kidney function and infection count. Will treat with cipro to cover for early pyelo. I will also send urine out for culture. I advised patient to follow-up with us if any changes in her symptoms or lack of improvement despite treatment. I also provided 1 diflucan for her at completion of antibiotic if needed for yeast infection. Patient verbalized understanding.   . Reviewed expectations re: course of current medical issues. . Discussed self-management of symptoms. . Outlined signs and symptoms indicating need for more acute intervention. . Patient verbalized understanding and all questions were answered. . See orders for this visit as documented in the electronic medical record. . Patient received an After-Visit Summary.  CMA or LPN served as scribe during this visit. History, Physical, and Plan performed by medical provider. Documentation and orders reviewed and attested to.  Jarold MottoSamantha Jolly Bleicher, PA-C

## 2016-08-11 ENCOUNTER — Telehealth: Payer: Self-pay | Admitting: Physician Assistant

## 2016-08-11 LAB — COMPREHENSIVE METABOLIC PANEL
ALT: 10 U/L (ref 0–35)
AST: 17 U/L (ref 0–37)
Albumin: 4.2 g/dL (ref 3.5–5.2)
Alkaline Phosphatase: 42 U/L (ref 39–117)
BUN: 16 mg/dL (ref 6–23)
CALCIUM: 9.3 mg/dL (ref 8.4–10.5)
CHLORIDE: 101 meq/L (ref 96–112)
CO2: 27 meq/L (ref 19–32)
Creatinine, Ser: 0.73 mg/dL (ref 0.40–1.20)
GFR: 93.79 mL/min (ref >60.00)
Glucose, Bld: 96 mg/dL (ref 70–99)
POTASSIUM: 4.6 meq/L (ref 3.5–5.1)
Sodium: 136 mEq/L (ref 135–145)
Total Bilirubin: 0.5 mg/dL (ref 0.2–1.2)
Total Protein: 7.4 g/dL (ref 6.0–8.3)

## 2016-08-11 LAB — CBC WITH DIFFERENTIAL/PLATELET
BASOS PCT: 1.2 % (ref 0.0–3.0)
Basophils Absolute: 0.1 10*3/uL (ref 0.0–0.1)
EOS ABS: 0.1 10*3/uL (ref 0.0–0.7)
EOS PCT: 0.5 % (ref 0.0–5.0)
HEMATOCRIT: 40.1 % (ref 36.0–46.0)
Hemoglobin: 13.3 g/dL (ref 12.0–15.0)
LYMPHS PCT: 19.7 % (ref 12.0–46.0)
Lymphs Abs: 2.2 10*3/uL (ref 0.7–4.0)
MCHC: 33.2 g/dL (ref 30.0–36.0)
MCV: 88.1 fl (ref 78.0–100.0)
Monocytes Absolute: 0.7 10*3/uL (ref 0.1–1.0)
Monocytes Relative: 6.5 % (ref 3.0–12.0)
NEUTROS ABS: 8 10*3/uL — AB (ref 1.4–7.7)
Neutrophils Relative %: 72.1 % (ref 43.0–77.0)
PLATELETS: 303 10*3/uL (ref 150.0–400.0)
RBC: 4.56 Mil/uL (ref 3.87–5.11)
RDW: 12.8 % (ref 11.5–15.5)
WBC: 11.1 10*3/uL — ABNORMAL HIGH (ref 4.0–10.5)

## 2016-08-11 NOTE — Telephone Encounter (Signed)
Patient called inquiring about lab results, [facility] to clinically disclose this information.

## 2016-08-11 NOTE — Telephone Encounter (Signed)
Patient provided the below information, verbalized understanding and no further questions:  Mary Martin,   I have received your labwork back. Your electrolytes, kidney and liver function look normal. Your white blood cell count (WBC count) is slightly elevated and is likely from your bladder infection. Please follow-up with us if you aren't feeling better or have any additional concerns.   I will contact you once I get your urine culture results -- likely won't be until early next week.   Take care,  Mary Martin

## 2016-08-13 LAB — URINE CULTURE

## 2016-11-08 ENCOUNTER — Other Ambulatory Visit: Payer: Self-pay | Admitting: Obstetrics and Gynecology

## 2016-11-08 DIAGNOSIS — R928 Other abnormal and inconclusive findings on diagnostic imaging of breast: Secondary | ICD-10-CM

## 2016-11-16 ENCOUNTER — Ambulatory Visit
Admission: RE | Admit: 2016-11-16 | Discharge: 2016-11-16 | Disposition: A | Discharge status: Home / Self Care | Payer: BC Managed Care – PPO | Source: Ambulatory Visit | Attending: Obstetrics and Gynecology | Admitting: Obstetrics and Gynecology

## 2016-11-16 ENCOUNTER — Other Ambulatory Visit: Payer: Self-pay | Admitting: Obstetrics and Gynecology

## 2016-11-16 DIAGNOSIS — R928 Other abnormal and inconclusive findings on diagnostic imaging of breast: Secondary | ICD-10-CM

## 2016-11-16 DIAGNOSIS — N6320 Unspecified lump in the left breast, unspecified quadrant: Secondary | ICD-10-CM

## 2016-11-21 ENCOUNTER — Other Ambulatory Visit: Payer: Self-pay | Admitting: Obstetrics and Gynecology

## 2016-11-21 ENCOUNTER — Ambulatory Visit
Admission: RE | Admit: 2016-11-21 | Discharge: 2016-11-21 | Disposition: A | Discharge status: Home / Self Care | Payer: BC Managed Care – PPO | Source: Ambulatory Visit | Attending: Obstetrics and Gynecology | Admitting: Obstetrics and Gynecology

## 2016-11-21 DIAGNOSIS — N6320 Unspecified lump in the left breast, unspecified quadrant: Secondary | ICD-10-CM

## 2017-01-02 ENCOUNTER — Ambulatory Visit: Payer: Self-pay | Admitting: *Deleted

## 2017-01-02 NOTE — Telephone Encounter (Signed)
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 She states she does not abuse her doctor by asking for medications but she would like to be treated for sinus infection. She is requesting Augmentin and Diflucan for after. Please contact her on her cell number- [phone].  Reason for Disposition . [1] Sinus congestion as part of a cold AND [2] present < 10 days  Answer Assessment - Initial Assessment Questions 1. LOCATION: "Where does it hurt?"      Pressure in face 2. ONSET: "When did the sinus pain start?"  (e.g., hours, days)      3-4 days 3. SEVERITY: "How bad is the pain?"   (Scale 1-10; mild, moderate or severe)   - MILD (1-3): doesn't interfere with normal activities    - MODERATE (4-7): interferes with normal activities (e.g., work or school) or awakens from sleep   - SEVERE (8-10): excruciating pain and patient unable to do any normal activities        Not asked- patient is giving symptoms and in a hurry- she is presently working as a Runner, broadcasting/film/videoteacher 4. RECURRENT SYMPTOM: "Have you ever had sinus problems before?" If so, ask: "When was the last time?" and "What happened that time?"      does have history of sinus infection in the past 5. NASAL CONGESTION: "Is the nose blocked?" If so, ask, "Can you open it or must you breathe through the mouth?"     Nasal drainage with sore throat- states ears are fine 6. NASAL DISCHARGE: "Do you have discharge from your nose?" If so ask, "What color?"     green 7. FEVER: "Do you have a fever?" If so, ask: "What is it, how was it measured, and when did it start?"      No fever 8. OTHER SYMPTOMS: "Do you have any other symptoms?" (e.g., sore throat, cough, earache, difficulty breathing)     Sore throat 9. PREGNANCY: "Is there any chance you are pregnant?" "When was your last menstrual period?"     Not asked  Protocols used: SINUS PAIN OR CONGESTION-A-AH

## 2017-01-03 ENCOUNTER — Telehealth: Payer: Self-pay

## 2017-01-03 NOTE — Telephone Encounter (Addendum)
Left message to call office. Nurse Triage to relay message when pt calls

## 2017-01-03 NOTE — Telephone Encounter (Signed)
Received call from pt and information provided by Dr. Alphonsus SiasLetvak explained to the patient regarding treatment for sinus infection. Pt states she has been treated in the past for sinus infections with antibiotics. Advised pt to call doctor's office back if no improvement seen within a week, so that treatment plan could be re-evaluated.

## 2017-01-03 NOTE — Telephone Encounter (Signed)
Patient called to request diflucan because she is on antibiotic and fears she will get yeast infection.

## 2017-01-03 NOTE — Telephone Encounter (Signed)
Copied from CRM [id number]. Topic: Quick Communication - See Telephone Encounter >> Jan 03, 2017  2:33 PM Guinevere FerrariMorris, Angellica Maddison E, NT wrote: CRM for notification. See Telephone encounter for:  01/03/17. Pt. Called in and wanted to know if the doctor would call in Augmentin and Diflucan because every time she takes an antibiotic she gets a yeast infection. [facility] [address] in [place].

## 2017-01-03 NOTE — Telephone Encounter (Signed)
Please let her know that sinus infections are most often viral (though occasionally are complicated later by bacterial infections) It is not considered appropriate to treat sinus infections with antibiotics early in the course since they will usually get better on their own (this is called antibiotic stewardship and is designed to prevent side effects and resistance of bacteria to our available antibiotics)

## 2017-01-04 MED ORDER — FLUCONAZOLE 150 MG PO TABS: ORAL_TABLET | ORAL | 0 refills | Status: DC

## 2017-01-04 NOTE — Telephone Encounter (Signed)
Left message on vm per DPR that rx was sent in.

## 2017-01-04 NOTE — Telephone Encounter (Signed)
Okay to send fluconazole 150mg  #2 x 0 Take once---repeat in 1 week prn

## 2017-05-03 ENCOUNTER — Other Ambulatory Visit: Payer: Self-pay | Admitting: Internal Medicine

## 2017-05-03 MED ORDER — OSELTAMIVIR PHOSPHATE 75 MG PO CAPS: 75.0000 mg | ORAL_CAPSULE | Freq: Every day | ORAL | 0 refills | Status: DC

## 2017-05-25 ENCOUNTER — Encounter: Payer: Self-pay | Admitting: Internal Medicine

## 2017-05-25 ENCOUNTER — Ambulatory Visit: Payer: BC Managed Care – PPO | Admitting: Internal Medicine

## 2017-05-25 VITALS — BP 122/84 | HR 94 | Temp 98.6°F | Wt 152.0 lb

## 2017-05-25 DIAGNOSIS — J0100 Acute maxillary sinusitis, unspecified: Secondary | ICD-10-CM | POA: Diagnosis not present

## 2017-05-25 MED ORDER — AMOXICILLIN 500 MG PO TABS: 1000.0000 mg | ORAL_TABLET | Freq: Two times a day (BID) | ORAL | 0 refills | Status: AC

## 2017-05-25 NOTE — Assessment & Plan Note (Signed)
Her usual symptoms that always progress Discussed symptomatic care Will Rx with amoxil

## 2017-05-25 NOTE — Progress Notes (Signed)
   Subjective:    Patient ID: Mary Martin, female    DOB: February 22, 1976, 42 y.o.   MRN: [id number]  HPI Here due to respiratory infection  Started 4 days with sore throat Very tired Entire mouth hurts today Left ear is clogges---hearing is off (not really painful) Rings under eyes  No fever Some chills Cough with some green sputum (not constant) Not SOB  Using mucinex DM, night and day cold meds Taking allergy meds Some of the nasal spray--but doesn't like it Tylenol  Current Outpatient Medications on File Prior to Visit  Medication Sig Dispense Refill  . fexofenadine (ALLEGRA) 180 MG tablet Take by mouth.    . fluticasone (FLONASE) 50 MCG/ACT nasal spray Place 2 sprays into both nostrils daily. 16 g 6  . naproxen sodium (ALEVE) 220 MG tablet Take 220 mg by mouth as needed.    . norgestimate-ethinyl estradiol (ORTHO-CYCLEN,SPRINTEC,PREVIFEM) 0.25-35 MG-MCG tablet Take 1 tablet by mouth daily.     No current facility-administered medications on file prior to visit.     No Known Allergies  History reviewed. No pertinent past medical history.  History reviewed. No pertinent surgical history.  Family History  Problem Relation Age of Onset  . Diabetes Paternal Grandmother   . Heart disease Neg Hx   . Cancer Neg Hx     Social History   Socioeconomic History  . Marital status: Married    Spouse name: Not on file  . Number of children: 2  . Years of education: Not on file  . Highest education level: Not on file  Social Needs  . Financial resource strain: Not on file  . Food insecurity - worry: Not on file  . Food insecurity - inability: Not on file  . Transportation needs - medical: Not on file  . Transportation needs - non-medical: Not on file  Occupational History  . Occupation: Grade school teacher  Tobacco Use  . Smoking status: Never Smoker  . Smokeless tobacco: Never Used  Substance and Sexual Activity  . Alcohol use: No    Alcohol/week: 0.0 oz  .  Drug use: No  . Sexual activity: Not on file  Other Topics Concern  . Not on file  Social History Narrative  . Not on file   Review of Systems No vomiting or diarrhea No headaches Appetite not too bad Some spotting after her cycle--no missed pills (will check pregnancy test &/or check with gyn)    Objective:   Physical Exam  Constitutional: No distress.  HENT:  Mouth/Throat: Oropharynx is clear and moist. No oropharyngeal exudate.  Mild maxillary tenderness TMs normal Mild nasal inflammation  Neck: No thyromegaly present.  Pulmonary/Chest: Effort normal and breath sounds normal. No respiratory distress. She has no wheezes. She has no rales.  Lymphadenopathy:    She has no cervical adenopathy.          Assessment & Plan:

## 2017-10-01 ENCOUNTER — Other Ambulatory Visit: Payer: Self-pay | Admitting: Obstetrics and Gynecology

## 2017-10-01 DIAGNOSIS — Z1231 Encounter for screening mammogram for malignant neoplasm of breast: Secondary | ICD-10-CM

## 2017-11-19 ENCOUNTER — Ambulatory Visit
Admission: RE | Admit: 2017-11-19 | Discharge: 2017-11-19 | Disposition: A | Discharge status: Home / Self Care | Payer: BC Managed Care – PPO | Source: Ambulatory Visit | Attending: Obstetrics and Gynecology | Admitting: Obstetrics and Gynecology

## 2017-11-19 DIAGNOSIS — Z1231 Encounter for screening mammogram for malignant neoplasm of breast: Secondary | ICD-10-CM

## 2018-11-04 ENCOUNTER — Other Ambulatory Visit: Payer: Self-pay | Admitting: Obstetrics and Gynecology

## 2018-11-04 DIAGNOSIS — Z1231 Encounter for screening mammogram for malignant neoplasm of breast: Secondary | ICD-10-CM

## 2018-12-18 ENCOUNTER — Ambulatory Visit
Admission: RE | Admit: 2018-12-18 | Discharge: 2018-12-18 | Disposition: A | Discharge status: Home / Self Care | Payer: BC Managed Care – PPO | Source: Ambulatory Visit | Attending: Obstetrics and Gynecology | Admitting: Obstetrics and Gynecology

## 2018-12-18 ENCOUNTER — Other Ambulatory Visit: Payer: Self-pay

## 2018-12-18 DIAGNOSIS — Z1231 Encounter for screening mammogram for malignant neoplasm of breast: Secondary | ICD-10-CM

## 2018-12-20 ENCOUNTER — Other Ambulatory Visit: Payer: Self-pay | Admitting: Obstetrics and Gynecology

## 2018-12-20 DIAGNOSIS — R928 Other abnormal and inconclusive findings on diagnostic imaging of breast: Secondary | ICD-10-CM

## 2018-12-24 ENCOUNTER — Ambulatory Visit
Admission: RE | Admit: 2018-12-24 | Discharge: 2018-12-24 | Disposition: A | Discharge status: Home / Self Care | Payer: BC Managed Care – PPO | Source: Ambulatory Visit | Attending: Obstetrics and Gynecology | Admitting: Obstetrics and Gynecology

## 2018-12-24 ENCOUNTER — Other Ambulatory Visit: Payer: Self-pay

## 2018-12-24 DIAGNOSIS — R928 Other abnormal and inconclusive findings on diagnostic imaging of breast: Secondary | ICD-10-CM

## 2019-07-21 ENCOUNTER — Other Ambulatory Visit: Payer: Self-pay

## 2019-07-21 ENCOUNTER — Telehealth (INDEPENDENT_AMBULATORY_CARE_PROVIDER_SITE_OTHER): Payer: BC Managed Care – PPO | Admitting: Family Medicine

## 2019-07-21 ENCOUNTER — Encounter: Payer: Self-pay | Admitting: Family Medicine

## 2019-07-21 VITALS — Temp 95.0°F | Wt 161.0 lb

## 2019-07-21 DIAGNOSIS — J069 Acute upper respiratory infection, unspecified: Secondary | ICD-10-CM | POA: Diagnosis not present

## 2019-07-21 MED ORDER — ALBUTEROL SULFATE HFA 108 (90 BASE) MCG/ACT IN AERS: 2.0000 | INHALATION_SPRAY | Freq: Four times a day (QID) | RESPIRATORY_TRACT | 0 refills | Status: DC | PRN

## 2019-07-21 NOTE — Progress Notes (Signed)
I connected with Mary Martin on 07/21/19 at  4:00 PM EDT by video and verified that I am speaking with the correct person using two identifiers.   I discussed the limitations, risks, security and privacy concerns of performing an evaluation and management service by video and the availability of in person appointments. I also discussed with the patient that there may be a patient responsible charge related to this service. The patient expressed understanding and agreed to proceed.  Patient location: driveway at her parents Provider Location: [place] Participants: Lesleigh Noe and Mary Paganini McDougel Cosma   Subjective:     Mary Paganini McDougel Prettyman is a 44 y.o. female presenting for Nasal Congestion (runny nose, cough off and on, x 4 weeks. Sore throat. Some headaches and some days facial pain.)     Sinus Problem This is a chronic problem. The current episode started 1 to 4 weeks ago. The problem has been waxing and waning since onset. There has been no fever. Associated symptoms include coughing (comes and goes), headaches and a sore throat. Pertinent negatives include no chills, sinus pressure or sneezing.   Had a productive cough initially, but now no longer productive. Will get a bark sounding cough which is worse at night. Sometimes during the day but more at nighttime.   Feels like something is stuck in her throat  Has not been tested for covid. Has been fully vaccinated. Thought this was allergies initially, but did notice that her family had similar symptoms  No loss of taste or smell.  No known exposures Completed vaccine series March 26  Got a stomach bug at home and then the sinus symptoms started  Daily allergy pill - claritin, theraflu, mucinex, cold/flu symptoms - has not dose nasal spray or flonase  Feeling tired and run down as well   Review of Systems  Constitutional: Negative for chills.  HENT: Positive for rhinorrhea and sore  throat. Negative for sinus pressure and sneezing.   Eyes: Negative for itching.  Respiratory: Positive for cough (comes and goes).   Neurological: Positive for headaches.     Social History   Tobacco Use  Smoking Status Never Smoker  Smokeless Tobacco Never Used        Objective:   BP Readings from Last 3 Encounters:  05/25/17 122/84  08/10/16 120/80  07/26/16 120/74   Wt Readings from Last 3 Encounters:  07/21/19 161 lb (73 kg)  05/25/17 152 lb (68.9 kg)  08/10/16 144 lb 6.1 oz (65.5 kg)   Temp (!) 95 F (35 C) Comment: per patient  Wt 161 lb (73 kg) Comment: per patient  LMP 06/09/2019   BMI 29.45 kg/m    Physical Exam  Occasional cough Speaking in complete sentences w/o distress Alert and oriented       Assessment & Plan:   Problem List Items Addressed This Visit    None    Visit Diagnoses    Viral URI with cough    -  Primary   Relevant Medications   albuterol (VENTOLIN HFA) 108 (90 Base) MCG/ACT inhaler      4 weeks of symptoms w/o fevers. Discussed that given ongoing fatigue wonder about possible covid infection and advised getting tested to help guide management. At this point given 4 weeks of symptoms she would no longer be contagious so would not need to isolate.   Trial of albuterol inhaler If covid testing negative - may consider course of abx for possible bacterial  symptoms if not improved. Though waxing and waning w/o fever, lowers bacterial suspicion.     Interactive audio and video telecommunications were attempted between this provider and patient, however failed, due to patient having technical difficulties OR patient did not have access to video capability.  We continued and completed visit with audio only.   Start Time: 4:10 End Time: 4:18    Return if symptoms worsen or fail to improve.  Lynnda Child, MD

## 2019-07-29 ENCOUNTER — Telehealth: Payer: Self-pay | Admitting: Internal Medicine

## 2019-07-29 DIAGNOSIS — J40 Bronchitis, not specified as acute or chronic: Secondary | ICD-10-CM

## 2019-07-29 MED ORDER — AZITHROMYCIN 250 MG PO TABS: ORAL_TABLET | ORAL | 0 refills | Status: DC

## 2019-07-29 MED ORDER — FLUCONAZOLE 150 MG PO TABS: 150.0000 mg | ORAL_TABLET | Freq: Once | ORAL | 0 refills | Status: AC

## 2019-07-29 NOTE — Telephone Encounter (Signed)
Patient advised.

## 2019-07-29 NOTE — Telephone Encounter (Signed)
Abx and diflucan sent to pharmacy

## 2019-07-29 NOTE — Telephone Encounter (Signed)
Pt called and is requesting something be called into the pharmacy for her symptoms. Pt is going on week 5 of the same symptoms. Symptoms have been inconsistent but ongoing total of time with the sinus cold has been x 5 weeks.  Pt c/o congestion, runny nose, headache, cough, wheezing, throat congestion and Francys Bolin/yellow mucous production. Pt states that she had mucous production in the first week but then this resolved and has now returned this week.  Pt denies a fever, SOB - state that she does "feel feverish" but the numbers never reflect a temp.   Pt is fully vaccinated as of March 26  Covid tested on 07/22/2019 d/t symptoms being consistent with Covid - this was negative.   Pt is using her Albuterol HFA as directed but does not seem to keep symptoms at bay.  Pt is requesting an antibiotic at this point - please send in small dose of Diflucan as well as she always gets a yeast infection.   No Known Allergies  [facility]

## 2019-07-29 NOTE — Telephone Encounter (Signed)
Had VV with Dr Selena Batten for symptoms last week

## 2019-08-12 ENCOUNTER — Other Ambulatory Visit: Payer: Self-pay | Admitting: Family Medicine

## 2019-08-12 DIAGNOSIS — J069 Acute upper respiratory infection, unspecified: Secondary | ICD-10-CM

## 2019-10-06 ENCOUNTER — Other Ambulatory Visit: Payer: Self-pay | Admitting: Obstetrics and Gynecology

## 2019-10-06 DIAGNOSIS — Z1231 Encounter for screening mammogram for malignant neoplasm of breast: Secondary | ICD-10-CM

## 2019-12-25 ENCOUNTER — Other Ambulatory Visit: Payer: Self-pay

## 2019-12-25 ENCOUNTER — Ambulatory Visit
Admission: RE | Admit: 2019-12-25 | Discharge: 2019-12-25 | Disposition: A | Discharge status: Home / Self Care | Payer: BC Managed Care – PPO | Source: Ambulatory Visit | Attending: Obstetrics and Gynecology | Admitting: Obstetrics and Gynecology

## 2019-12-25 DIAGNOSIS — Z1231 Encounter for screening mammogram for malignant neoplasm of breast: Secondary | ICD-10-CM

## 2020-07-19 ENCOUNTER — Other Ambulatory Visit: Payer: Self-pay

## 2020-07-19 ENCOUNTER — Encounter: Payer: Self-pay | Admitting: Family Medicine

## 2020-07-19 ENCOUNTER — Other Ambulatory Visit: Payer: Self-pay | Admitting: Family Medicine

## 2020-07-19 ENCOUNTER — Other Ambulatory Visit (INDEPENDENT_AMBULATORY_CARE_PROVIDER_SITE_OTHER): Payer: BC Managed Care – PPO

## 2020-07-19 ENCOUNTER — Telehealth (INDEPENDENT_AMBULATORY_CARE_PROVIDER_SITE_OTHER): Payer: BC Managed Care – PPO | Admitting: Family Medicine

## 2020-07-19 VITALS — Temp 99.7°F

## 2020-07-19 DIAGNOSIS — J0100 Acute maxillary sinusitis, unspecified: Secondary | ICD-10-CM | POA: Diagnosis not present

## 2020-07-19 DIAGNOSIS — J069 Acute upper respiratory infection, unspecified: Secondary | ICD-10-CM

## 2020-07-19 DIAGNOSIS — J029 Acute pharyngitis, unspecified: Secondary | ICD-10-CM | POA: Insufficient documentation

## 2020-07-19 LAB — POC INFLUENZA A&B (BINAX/QUICKVUE)
Influenza A, POC: NEGATIVE
Influenza B, POC: NEGATIVE

## 2020-07-19 MED ORDER — FLUCONAZOLE 150 MG PO TABS: 150.0000 mg | ORAL_TABLET | Freq: Once | ORAL | 0 refills | Status: AC

## 2020-07-19 MED ORDER — AMOXICILLIN-POT CLAVULANATE 875-125 MG PO TABS: 1.0000 | ORAL_TABLET | Freq: Two times a day (BID) | ORAL | 0 refills | Status: DC

## 2020-07-19 NOTE — Patient Instructions (Signed)
Drink fluids and rest  mucinex DM is good for cough and congestion  Nasal saline for congestion as needed  Tylenol for fever or pain or headache  Please alert [place] if symptoms worsen (if severe or short of breath please go to the ER)   A steroid nasal spray like flonase or nasacort may help with nasal congestion and ear pain   Take the augmentin as directed for sinus infection   The office will call to set up covid and flu testing  Please isolate yourself until we get a result

## 2020-07-19 NOTE — Assessment & Plan Note (Signed)
Pt states she is prone  Some congestion/facial pain after 2 bouts of a virus since march (also allergies) Also ear pain-poss early OM Facial pain and green d/c augmentin sent in to pharmacy  Fluids/rest Symptom care  Nasal steroid may help congestion  Update if not starting to improve in a week or if worsening

## 2020-07-19 NOTE — Progress Notes (Signed)
Virtual Visit via Video Note  I connected with Mary Martin on 07/19/20 at  2:30 PM EDT by a video enabled telemedicine application and verified that I am speaking with the correct person using two identifiers.  Location: Patient: work Provider: office   I discussed the limitations of evaluation and management by telemedicine and the availability of in person appointments. The patient expressed understanding and agreed to proceed.  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 Viral URI with cough 07/19/2020  . Preventative health care 06/24/2014  . Fatigue 06/24/2014  . Acute sinusitis 02/11/2014   History reviewed. No pertinent past medical history. Past Surgical History:  Procedure Laterality Date  . BREAST BIOPSY Left    benign   Social History   Tobacco Use  . Smoking status: Never Smoker  . Smokeless tobacco: Never Used  Substance Use Topics  . Alcohol use: No    Alcohol/week: 0.0 standard drinks  . Drug use: No   Family History  Problem Relation Age of Onset  . Diabetes  Paternal Grandmother   . Heart disease Neg Hx   . Cancer Neg Hx    No Known Allergies Current Outpatient Medications on File Prior to Visit  Medication Sig Dispense Refill  . albuterol (VENTOLIN HFA) 108 (90 Base) MCG/ACT inhaler TAKE 2 PUFFS BY MOUTH EVERY 6 HOURS AS NEEDED FOR WHEEZE OR SHORTNESS OF BREATH 18 g 1  . buPROPion (WELLBUTRIN XL) 150 MG 24 hr tablet Take 1 tablet by mouth daily.    Marland Kitchen loratadine (CLARITIN) 10 MG tablet Take 10 mg by mouth daily.    . norgestimate-ethinyl estradiol (ORTHO-CYCLEN,SPRINTEC,PREVIFEM) 0.25-35 MG-MCG tablet Take 1 tablet by mouth daily.     No current facility-administered medications on file prior to visit.     Review of Systems  Constitutional: Positive for fever. Negative for chills and malaise/fatigue.  HENT: Positive for congestion, sinus pain and sore throat. Negative for ear pain.   Eyes: Negative for blurred vision, discharge and redness.  Respiratory: Positive for cough and sputum production. Negative for shortness of breath, wheezing and stridor.   Cardiovascular: Negative for chest pain, palpitations and leg swelling.  Gastrointestinal: Negative for abdominal pain, diarrhea, nausea and vomiting.  Musculoskeletal: Negative for myalgias.  Skin: Negative for rash.  Neurological: Negative for dizziness and headaches.    Observations/Objective: Patient appears well, in no distress Weight is baseline  No facial swelling or  asymmetry Mildly hoarse voice, occ clears her throat  No obvious tremor or mobility impairment Moving neck and UEs normally Able to hear the call well  No cough or shortness of breath during interview  Talkative and mentally sharp with no cognitive changes No skin changes on face or neck , no rash or pallor Affect is normal    Assessment and Plan: Problem List Items Addressed This Visit      Respiratory   Acute sinusitis - Primary    Pt states she is prone  Some congestion/facial pain after 2 bouts of a  virus since march (also allergies) Also ear pain-poss early OM Facial pain and green d/c augmentin sent in to pharmacy  Fluids/rest Symptom care  Nasal steroid may help congestion  Update if not starting to improve in a week or if worsening         Relevant Medications   amoxicillin-clavulanate (AUGMENTIN) 875-125 MG tablet   fluconazole (DIFLUCAN) 150 MG tablet   Viral URI with cough    See A/P for sinusitis  Some low grade temp  Ordered covid and flu tests-today  Will quarantine until results  ER parameters discussed      Relevant Medications   fluconazole (DIFLUCAN) 150 MG tablet       Follow Up Instructions: Drink fluids and rest  mucinex DM is good for cough and congestion  Nasal saline for congestion as needed  Tylenol for fever or pain or headache  Please alert [place] if symptoms worsen (if severe or short of breath please go to the ER)   A steroid nasal spray like flonase or nasacort may help with nasal congestion and ear pain   Take the augmentin as directed for sinus infection   The office will call to set up covid and flu testing  Please isolate yourself until we get a result    I discussed the assessment and treatment plan with the patient. The patient was provided an opportunity to ask questions and all were answered. The patient agreed with the plan and demonstrated an understanding of the instructions.   The patient was advised to call back or seek an in-person evaluation if the symptoms worsen or if the condition fails to improve as anticipated.    Roxy Manns, MD

## 2020-07-19 NOTE — Assessment & Plan Note (Signed)
See A/P for sinusitis  Some low grade temp  Ordered covid and flu tests-today  Will quarantine until results  ER parameters discussed

## 2020-07-20 LAB — SARS-COV-2, NAA 2 DAY TAT

## 2020-07-20 LAB — NOVEL CORONAVIRUS, NAA: SARS-CoV-2, NAA: DETECTED — AB

## 2021-02-14 ENCOUNTER — Encounter: Payer: Self-pay | Admitting: Internal Medicine

## 2021-02-14 ENCOUNTER — Telehealth (INDEPENDENT_AMBULATORY_CARE_PROVIDER_SITE_OTHER): Payer: BC Managed Care – PPO | Admitting: Internal Medicine

## 2021-02-14 ENCOUNTER — Other Ambulatory Visit: Payer: Self-pay

## 2021-02-14 DIAGNOSIS — J069 Acute upper respiratory infection, unspecified: Secondary | ICD-10-CM

## 2021-02-14 NOTE — Assessment & Plan Note (Signed)
Has fairly classic symptoms The ongoing fatigue (wonders if she needs to spend the day in bed again tomorrow) make me consider the flu---but she is not having respiratory problems and is day 4--so I wouldn't use an antiviral anyway. Discussed analgesics (like tylenol) regularly and anti-tussives Return to work as able

## 2021-02-14 NOTE — Progress Notes (Signed)
   Subjective:    Patient ID: Mary Martin, female    DOB: Apr 16, 1975, 45 y.o.   MRN: [id number]  HPI Video virtual visit due to respiratory infection Identification done Reviewed limitations and billing and she gave consent Participants--patient in her home and I am in my office  Started with sore throat 4 days ago Some hoarseness by the next day Low grade fever--up to 100 Cough---dry sometimes and can be productive. Gets pain in head with the cough (top of her head) Some achiness in back and shoulders  Exposed to lots of illnesses at school Did try to go briefly this morning---but had to go home  Didn't test for COVID Didn't take flu vaccine yet  Has been sleeping more No SOB--but resting mostly No ear pain Some pain in face ---no headache Not much drainage though  Has used mucinex--now with day/night pack  Current Outpatient Medications on File Prior to Visit  Medication Sig Dispense Refill   albuterol (VENTOLIN HFA) 108 (90 Base) MCG/ACT inhaler TAKE 2 PUFFS BY MOUTH EVERY 6 HOURS AS NEEDED FOR WHEEZE OR SHORTNESS OF BREATH 18 g 1   buPROPion (WELLBUTRIN XL) 150 MG 24 hr tablet Take 1 tablet by mouth daily.     loratadine (CLARITIN) 10 MG tablet Take 10 mg by mouth daily.     norgestimate-ethinyl estradiol (ORTHO-CYCLEN,SPRINTEC,PREVIFEM) 0.25-35 MG-MCG tablet Take 1 tablet by mouth daily.     No current facility-administered medications on file prior to visit.    No Known Allergies  History reviewed. No pertinent past medical history.  Past Surgical History:  Procedure Laterality Date   BREAST BIOPSY Left    benign    Family History  Problem Relation Age of Onset   Diabetes Paternal Grandmother    Heart disease Neg Hx    Cancer Neg Hx     Social History   Socioeconomic History   Marital status: Married    Spouse name: Not on file   Number of children: 2   Years of education: Not on file   Highest education level: Not on file   Occupational History   Occupation: Grade school teacher  Tobacco Use   Smoking status: Never   Smokeless tobacco: Never  Substance and Sexual Activity   Alcohol use: No    Alcohol/week: 0.0 standard drinks   Drug use: No   Sexual activity: Not on file  Other Topics Concern   Not on file  Social History Narrative   Not on file   Social Determinants of Health   Financial Resource Strain: Not on file  Food Insecurity: Not on file  Transportation Needs: Not on file  Physical Activity: Not on file  Stress: Not on file  Social Connections: Not on file  Intimate Partner Violence: Not on file   Review of Systems No N/V Appetite is off some----eating okay though     Objective:   Physical Exam Constitutional:      Appearance: Normal appearance.  Pulmonary:     Effort: Pulmonary effort is normal. No respiratory distress.  Neurological:     Mental Status: She is alert.           Assessment & Plan:

## 2021-02-28 ENCOUNTER — Ambulatory Visit
Admission: RE | Admit: 2021-02-28 | Discharge: 2021-02-28 | Disposition: A | Discharge status: Home / Self Care | Payer: BC Managed Care – PPO | Source: Ambulatory Visit | Attending: Obstetrics and Gynecology | Admitting: Obstetrics and Gynecology

## 2021-02-28 ENCOUNTER — Other Ambulatory Visit: Payer: Self-pay | Admitting: Obstetrics and Gynecology

## 2021-02-28 ENCOUNTER — Other Ambulatory Visit: Payer: Self-pay

## 2021-02-28 DIAGNOSIS — Z1231 Encounter for screening mammogram for malignant neoplasm of breast: Secondary | ICD-10-CM

## 2021-12-17 ENCOUNTER — Ambulatory Visit
Admission: EM | Admit: 2021-12-17 | Discharge: 2021-12-17 | Disposition: A | Discharge status: Home / Self Care | Payer: BC Managed Care – PPO | Attending: Internal Medicine | Admitting: Internal Medicine

## 2021-12-17 DIAGNOSIS — J069 Acute upper respiratory infection, unspecified: Secondary | ICD-10-CM | POA: Diagnosis not present

## 2021-12-17 DIAGNOSIS — R053 Chronic cough: Secondary | ICD-10-CM

## 2021-12-17 MED ORDER — BENZONATATE 100 MG PO CAPS: 100.0000 mg | ORAL_CAPSULE | Freq: Three times a day (TID) | ORAL | 0 refills | Status: DC | PRN

## 2021-12-17 MED ORDER — PREDNISONE 20 MG PO TABS: 40.0000 mg | ORAL_TABLET | Freq: Every day | ORAL | 0 refills | Status: AC

## 2021-12-17 MED ORDER — AMOXICILLIN-POT CLAVULANATE 875-125 MG PO TABS: 1.0000 | ORAL_TABLET | Freq: Two times a day (BID) | ORAL | 0 refills | Status: DC

## 2021-12-17 MED ORDER — FLUCONAZOLE 150 MG PO TABS: 150.0000 mg | ORAL_TABLET | Freq: Every day | ORAL | 0 refills | Status: DC

## 2021-12-17 NOTE — ED Triage Notes (Signed)
Pt c/o "my throat feels off," cough, "ears feel funny from time to time like they need to be stretched out or removed from my head."   Onset ~ labor day

## 2021-12-17 NOTE — ED Provider Notes (Addendum)
[facility]    CSN: [id number] Arrival date & time: 12/17/21  6767      History   Chief Complaint Chief Complaint  Patient presents with   Cough    HPI Tulani McDougel Dezeeuw is a 46 y.o. female.   Patient presents with nasal congestion and cough that has been present for a little over a month.  Patient reports that she has thick, green mucus coming from her nose.  Cough is productive at times and dry at times.  Denies any associated fever.  Patient reports that she has a history of recurrent sinus infections as well.  Denies chest pain, shortness of breath, sore throat, nausea, vomiting, diarrhea, abdominal pain.  Patient has taken multiple over-the-counter cold and flu medications with no improvement in symptoms.  Denies history of asthma or COPD.     Cough   History reviewed. No pertinent past medical history.  Patient Active Problem List   Diagnosis Date Noted   Viral URI with cough 07/19/2020   Preventative health care 06/24/2014   Fatigue 06/24/2014   Acute sinusitis 02/11/2014    Past Surgical History:  Procedure Laterality Date   BREAST BIOPSY Left    benign    OB History   No obstetric history on file.      Home Medications    Prior to Admission medications   Medication Sig Start Date End Date Taking? Authorizing Provider  albuterol (VENTOLIN HFA) 108 (90 Base) MCG/ACT inhaler TAKE 2 PUFFS BY MOUTH EVERY 6 HOURS AS NEEDED FOR WHEEZE OR SHORTNESS OF BREATH 08/20/19   Tillman Abide I, MD  amoxicillin-clavulanate (AUGMENTIN) 875-125 MG tablet Take 1 tablet by mouth every 12 (twelve) hours. 12/17/21  Yes Trevor Wilkie, Rolly Salter E, FNP  benzonatate (TESSALON) 100 MG capsule Take 1 capsule (100 mg total) by mouth every 8 (eight) hours as needed for cough. 12/17/21  Yes Braden Deloach, Rolly Salter E, FNP  buPROPion (WELLBUTRIN XL) 150 MG 24 hr tablet Take 1 tablet by mouth daily.    [provider]  fluconazole (DIFLUCAN) 150 MG tablet Take 1 tablet (150 mg total)  by mouth daily. Take at first sign of vaginal yeast 12/17/21  Yes Karam Dunson, [place] E, FNP  loratadine (CLARITIN) 10 MG tablet Take 10 mg by mouth daily.    [provider]  norgestimate-ethinyl estradiol (ORTHO-CYCLEN,SPRINTEC,PREVIFEM) 0.25-35 MG-MCG tablet Take 1 tablet by mouth daily.    [provider]  predniSONE (DELTASONE) 20 MG tablet Take 2 tablets (40 mg total) by mouth daily for 5 days. 12/17/21 12/22/21 Yes Jaxten Brosh, Acie Fredrickson, FNP    Family History Family History  Problem Relation Age of Onset   Diabetes Paternal Grandmother    Heart disease Neg Hx    Cancer Neg Hx     Social History Social History   Tobacco Use   Smoking status: Never   Smokeless tobacco: Never  Substance Use Topics   Alcohol use: No    Alcohol/week: 0.0 standard drinks of alcohol   Drug use: No     Allergies   Patient has no known allergies.   Review of Systems Review of Systems Per HPI  Physical Exam Triage Vital Signs ED Triage Vitals [12/17/21 0945]  Enc Vitals Group     BP 119/69     Pulse Rate 82     Resp 16     Temp 98 F (36.7 C)     Temp Source Oral     SpO2 98 %  Weight      Height      Head Circumference      Peak Flow      Pain Score 6     Pain Loc      Pain Edu?      Excl. in [place]?    No data found.  Updated Vital Signs BP 119/69 (BP Location: Left Arm)   Pulse 82   Temp 98 F (36.7 C) (Oral)   Resp 16   SpO2 98%   Visual Acuity Right Eye Distance:   Left Eye Distance:   Bilateral Distance:    Right Eye Near:   Left Eye Near:    Bilateral Near:     Physical Exam Constitutional:      General: She is not in acute distress.    Appearance: Normal appearance. She is not toxic-appearing or diaphoretic.  HENT:     Head: Normocephalic and atraumatic.     Right Ear: Ear canal normal. No drainage, swelling or tenderness. A middle ear effusion is present. There is no impacted cerumen. No mastoid tenderness. Tympanic membrane is not perforated,  erythematous or bulging.     Left Ear: Ear canal normal. No drainage, swelling or tenderness. A middle ear effusion is present. There is no impacted cerumen. No mastoid tenderness. Tympanic membrane is not perforated, erythematous or bulging.     Nose: Congestion present.     Mouth/Throat:     Mouth: Mucous membranes are moist.     Pharynx: No posterior oropharyngeal erythema.  Eyes:     Extraocular Movements: Extraocular movements intact.     Conjunctiva/sclera: Conjunctivae normal.     Pupils: Pupils are equal, round, and reactive to light.  Cardiovascular:     Rate and Rhythm: Normal rate and regular rhythm.     Pulses: Normal pulses.     Heart sounds: Normal heart sounds.  Pulmonary:     Effort: Pulmonary effort is normal. No respiratory distress.     Breath sounds: Normal breath sounds. No stridor. No wheezing, rhonchi or rales.  Abdominal:     General: Abdomen is flat. Bowel sounds are normal.     Palpations: Abdomen is soft.  Musculoskeletal:        General: Normal range of motion.     Cervical back: Normal range of motion.  Skin:    General: Skin is warm and dry.  Neurological:     General: No focal deficit present.     Mental Status: She is alert and oriented to person, place, and time. Mental status is at baseline.  Psychiatric:        Mood and Affect: Mood normal.        Behavior: Behavior normal.      UC Treatments / Results  Labs (all labs ordered are listed, but only abnormal results are displayed) Labs Reviewed - No data to display  EKG   Radiology No results found.  Procedures Procedures (including critical care time)  Medications Ordered in UC Medications - No data to display  Initial Impression / Assessment and Plan / UC Course  I have reviewed the triage vital signs and the nursing notes.  Pertinent labs & imaging results that were available during my care of the patient were reviewed by me and considered in my medical decision making (see  chart for details).     Patient has persistent upper respiratory infection and cough with symptoms concerning for secondary bacterial infection and possible bronchitis.  Suggested chest x-ray  given persistent cough to rule out pneumonia but patient declined.  Patient was very adamant that she did not want a chest x-ray completed.  Risks associated with not doing chest x-ray were discussed with patient.  Patient voiced understanding.  I do think patient would benefit from antibiotic and prednisone to decrease inflammation.  Patient requesting Diflucan as antibiotics typically give her yeast infection so patient sent 1 pill to take at first sign of vaginal yeast.  Patient has taken prednisone before and tolerated well.  No obvious contraindications to prednisone noted on exam as patient takes wellbutrin for anxiety/depression.  Benzonatate prescribed to take as needed for cough as well.  Patient was advised to follow-up if symptoms persist or worsen.  Patient verbalized understanding and was agreeable with plan. Final Clinical Impressions(s) / UC Diagnoses   Final diagnoses:  Acute upper respiratory infection  Persistent cough for 3 weeks or longer     Discharge Instructions      I have prescribed you four medications to help alleviate symptoms.  Please follow-up if symptoms persist or worsen.    ED Prescriptions     Medication Sig Dispense Auth. Provider   amoxicillin-clavulanate (AUGMENTIN) 875-125 MG tablet Take 1 tablet by mouth every 12 (twelve) hours. 14 tablet [place], [place] E, [place]   fluconazole (DIFLUCAN) 150 MG tablet Take 1 tablet (150 mg total) by mouth daily. Take at first sign of vaginal yeast 1 tablet [place], [place] E, [place]   predniSONE (DELTASONE) 20 MG tablet Take 2 tablets (40 mg total) by mouth daily for 5 days. 10 tablet [place], [place]ace[place] E, [place]   benzonatate (TESSALON) 100 MG capsule Take 1 capsule (100 mg total) by mouth every 8 (eight) hours as needed for cough. 21 capsule [place],  Acie Fredrickson, [place]      PDMP not reviewed this encounter.   Gustavus Bryant, [place] 12/17/21 1050    [address], [place] 12/17/21 1051

## 2021-12-17 NOTE — Discharge Instructions (Signed)
I have prescribed you four medications to help alleviate symptoms.  Please follow-up if symptoms persist or worsen.

## 2022-01-22 IMAGING — MG MM DIGITAL SCREENING BILAT W/ TOMO AND CAD
8 series · 8 of 24 positions shown · non-contrast
Comparison: Previous exam(s).

CLINICAL DATA: Screening.

EXAM:
DIGITAL SCREENING BILATERAL MAMMOGRAM WITH TOMOSYNTHESIS AND CAD
TECHNIQUE: Bilateral screening digital craniocaudal and mediolateral oblique
mammograms were obtained. Bilateral screening digital breast
tomosynthesis was performed. The images were evaluated with
computer-aided detection.

[R CC synth-2D]
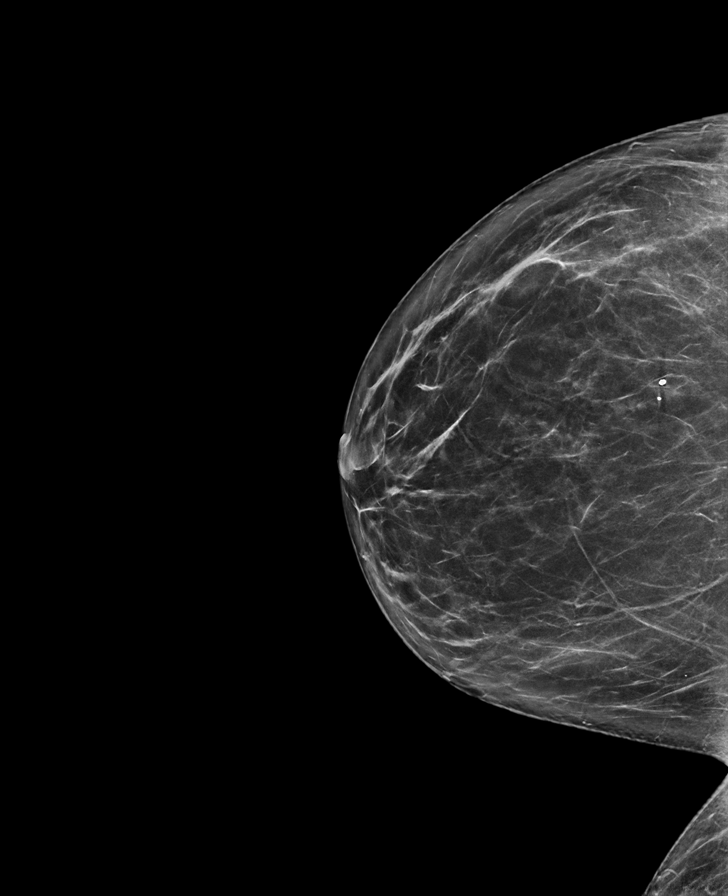

[L CC synth-2D]
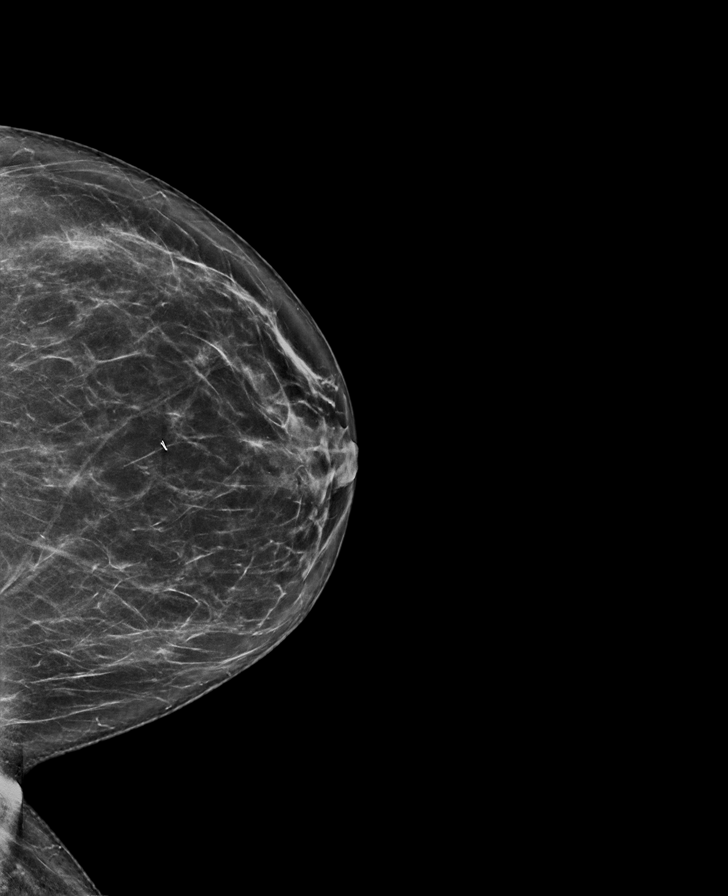

[L MLO synth-2D]
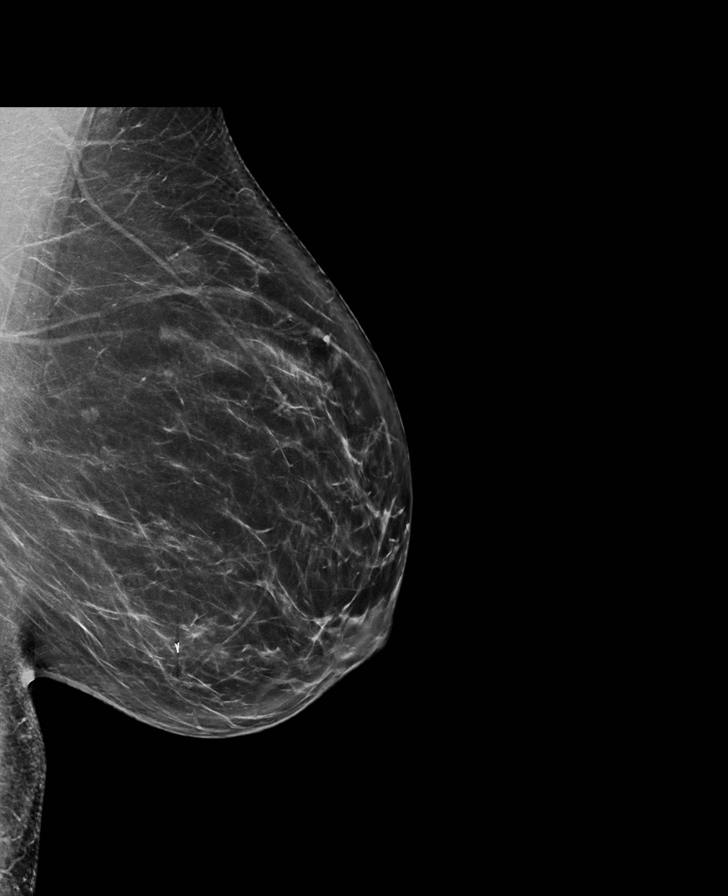

[R MLO synth-2D]
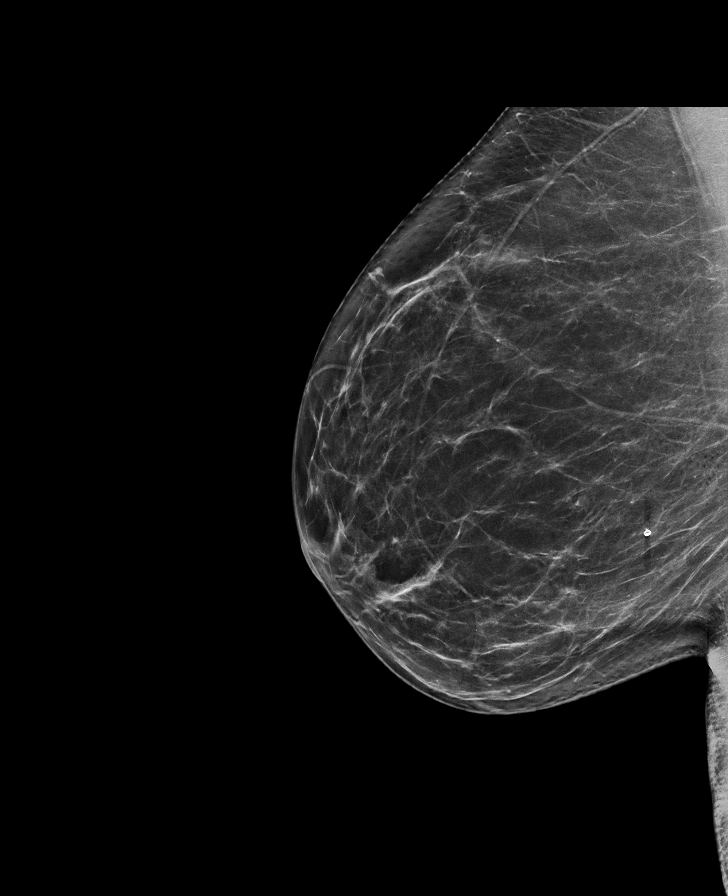

[R MLO tomo · tomo slice 39/77.0]
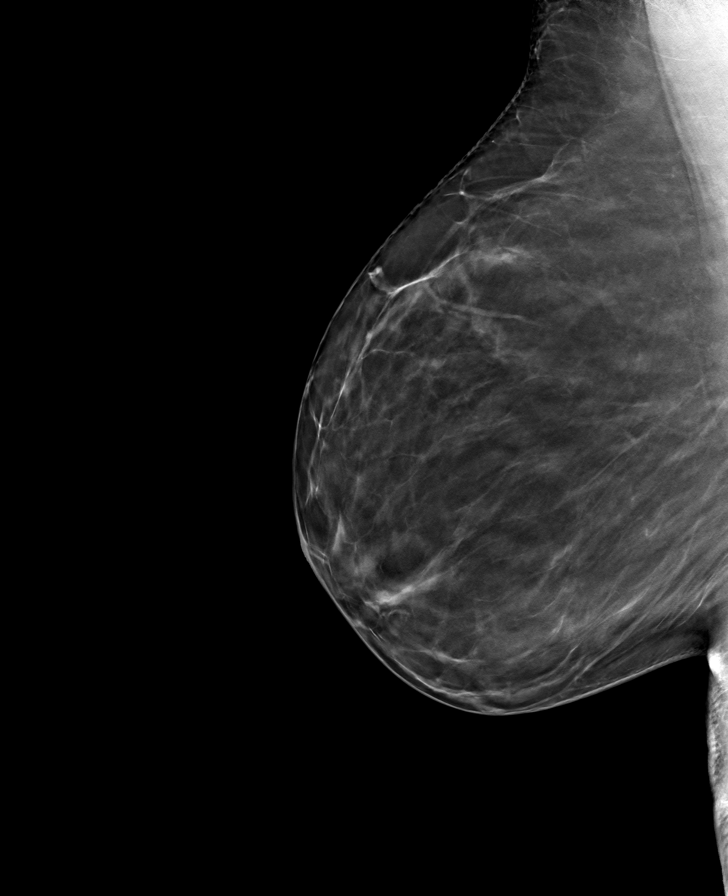

[R CC tomo · tomo slice 38/75.0]
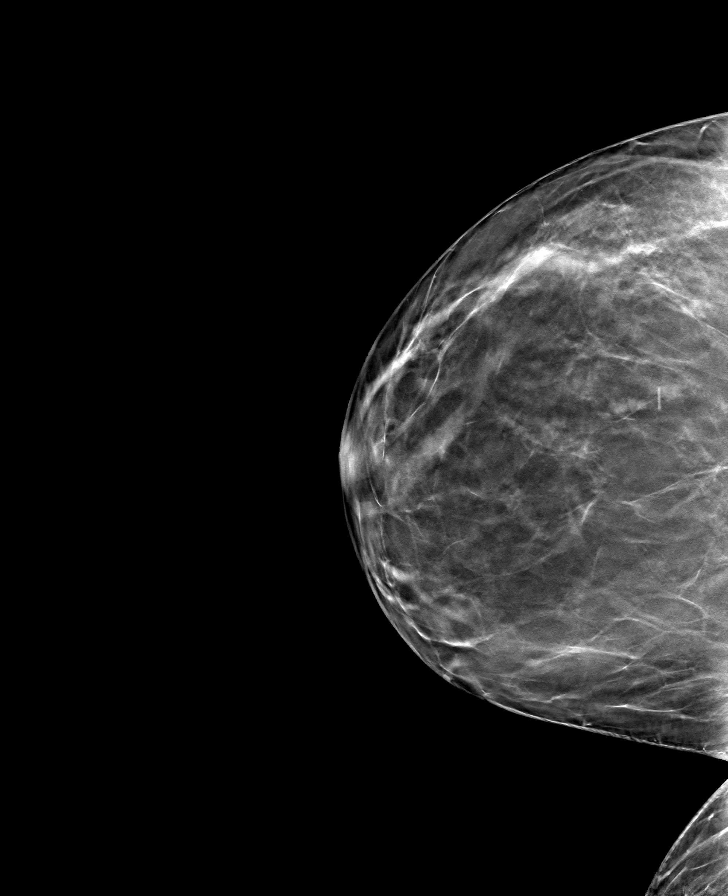

[L CC tomo · tomo slice 38/75.0]
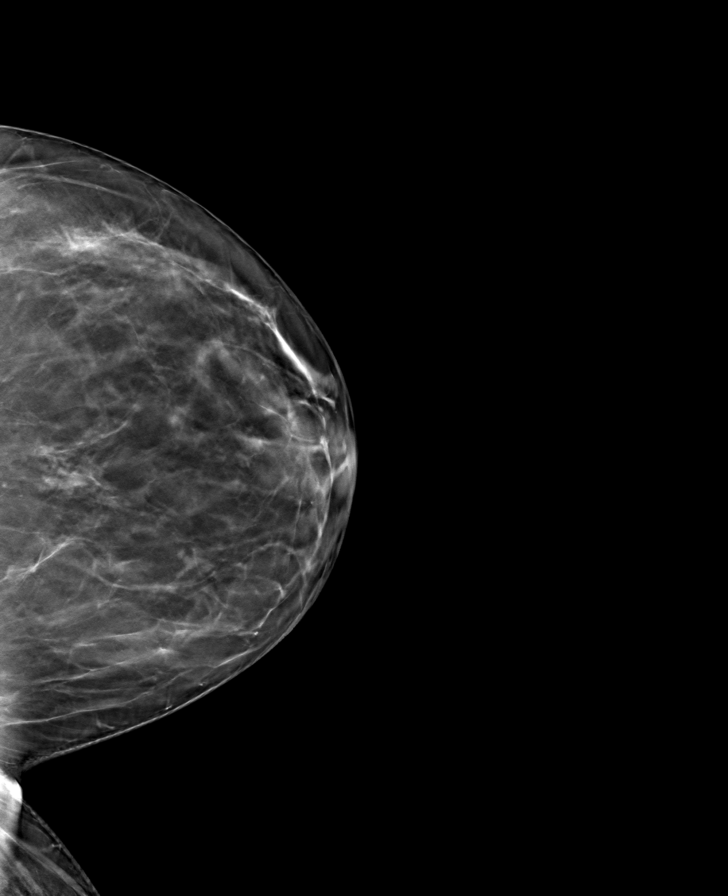

[L MLO tomo · tomo slice 42/83.0]
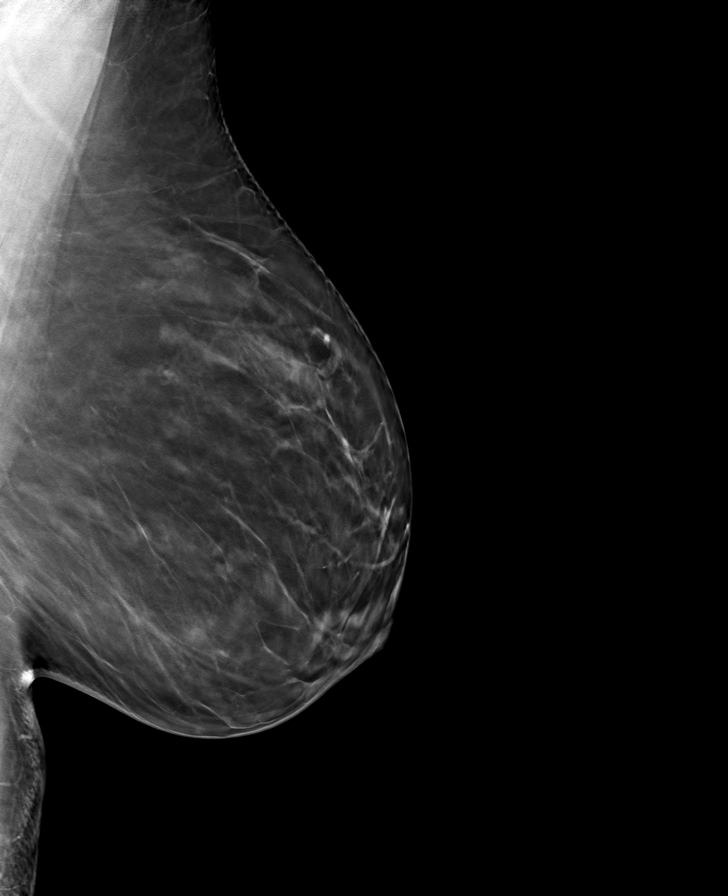

[8 of 24 positions shown; findings below may reference images not displayed]

ACR Breast Density Category b: There are scattered areas of
fibroglandular density.
FINDINGS: There are no findings suspicious for malignancy.
IMPRESSION: No mammographic evidence of malignancy. A result letter of this
screening mammogram will be mailed directly to the patient.

RECOMMENDATION:
Screening mammogram in one year. (Code:51-O-LD2)

BI-RADS CATEGORY  1: Negative.

## 2022-01-30 ENCOUNTER — Other Ambulatory Visit: Payer: Self-pay | Admitting: Obstetrics and Gynecology

## 2022-01-30 DIAGNOSIS — Z1231 Encounter for screening mammogram for malignant neoplasm of breast: Secondary | ICD-10-CM

## 2022-03-14 ENCOUNTER — Ambulatory Visit
Admission: RE | Admit: 2022-03-14 | Discharge: 2022-03-14 | Disposition: A | Discharge status: Home / Self Care | Payer: BC Managed Care – PPO | Source: Ambulatory Visit | Attending: Obstetrics and Gynecology | Admitting: Obstetrics and Gynecology

## 2022-03-14 DIAGNOSIS — Z1231 Encounter for screening mammogram for malignant neoplasm of breast: Secondary | ICD-10-CM

## 2022-05-01 ENCOUNTER — Ambulatory Visit
Admission: RE | Admit: 2022-05-01 | Discharge: 2022-05-01 | Disposition: A | Discharge status: Home / Self Care | Payer: BC Managed Care – PPO | Source: Ambulatory Visit | Attending: Emergency Medicine | Admitting: Emergency Medicine

## 2022-05-01 VITALS — BP 130/85 | HR 96 | Temp 99.9°F | Resp 20

## 2022-05-01 DIAGNOSIS — J020 Streptococcal pharyngitis: Secondary | ICD-10-CM

## 2022-05-01 DIAGNOSIS — H66002 Acute suppurative otitis media without spontaneous rupture of ear drum, left ear: Secondary | ICD-10-CM

## 2022-05-01 HISTORY — DX: Anxiety disorder, unspecified: F41.9

## 2022-05-01 LAB — POCT INFLUENZA A/B
Influenza A, POC: NEGATIVE
Influenza B, POC: NEGATIVE

## 2022-05-01 LAB — POCT RAPID STREP A (OFFICE): Rapid Strep A Screen: POSITIVE — AB

## 2022-05-01 LAB — POC SARS CORONAVIRUS 2 AG -  ED: SARS Coronavirus 2 Ag: NEGATIVE

## 2022-05-01 MED ORDER — FLUCONAZOLE 150 MG PO TABS: ORAL_TABLET | ORAL | 0 refills | Status: DC

## 2022-05-01 MED ORDER — ACETAMINOPHEN 325 MG PO TABS
650.0000 mg | ORAL_TABLET | Freq: Once | ORAL | Status: AC
  Administered 2022-05-01: 650 mg via ORAL

## 2022-05-01 MED ORDER — CEFDINIR 300 MG PO CAPS: 300.0000 mg | ORAL_CAPSULE | Freq: Two times a day (BID) | ORAL | 0 refills | Status: AC

## 2022-05-01 NOTE — ED Provider Notes (Signed)
[facility]    CSN: [id number] Arrival date & time: 05/01/22  1528    HISTORY   Chief Complaint  Patient presents with   Ear Fullness   1600 appt   Sore Throat   Leg Pain   HPI Mary Martin is a pleasant, 47 y.o. female who presents to urgent care today. Pt c/o nasal congestion and sore throat x approx one week.  States she noted yesterday that she began to have achy legs and today began to have fullness and pain in her left ear.  Patient states that this morning, she took a home COVID-19 test which was negative.  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  Patient states has not tried any thing to alleviate her symptoms.  Patient denies nausea, vomiting, diarrhea.  Patient states her throat became significantly more sore today than it has been for the past week.  The history is provided by the patient.   Past Medical History:  Diagnosis Date   Anxiety    Patient Active Problem List   Diagnosis Date Noted   Viral URI with cough 07/19/2020   Preventative health care 06/24/2014   Fatigue 06/24/2014   Acute sinusitis 02/11/2014   Past Surgical History:  Procedure Laterality Date   BREAST BIOPSY Left    benign   TONSILLECTOMY     OB History   No obstetric history on file.    Home Medications    Prior to Admission medications   Medication Sig Start Date End Date Taking? Authorizing Provider  albuterol (VENTOLIN HFA) 108 (90 Base) MCG/ACT inhaler TAKE 2 PUFFS BY MOUTH EVERY 6 HOURS AS NEEDED FOR WHEEZE OR SHORTNESS OF BREATH 08/20/19   Viviana Simpler I, MD  amoxicillin-clavulanate (AUGMENTIN) 875-125 MG tablet Take 1 tablet by mouth every 12 (twelve) hours. 12/17/21   Teodora Medici, FNP  benzonatate (TESSALON) 100 MG capsule Take 1 capsule (100 mg total) by mouth every 8 (eight) hours as needed for cough. 12/17/21   Teodora Medici, FNP  buPROPion  (WELLBUTRIN XL) 150 MG 24 hr tablet Take 1 tablet by mouth daily.    [provider]  fluconazole (DIFLUCAN) 150 MG tablet Take 1 tablet (150 mg total) by mouth daily. Take at first sign of vaginal yeast 12/17/21   Teodora Medici, FNP  loratadine (CLARITIN) 10 MG tablet Take 10 mg by mouth daily.    [provider]  norgestimate-ethinyl estradiol (ORTHO-CYCLEN,SPRINTEC,PREVIFEM) 0.25-35 MG-MCG tablet Take 1 tablet by mouth daily.    [provider]    Family History Family History  Problem Relation Age of Onset   Cancer Mother    Healthy Father    Diabetes Paternal Grandmother    Heart disease Neg Hx    Social History Social History   Tobacco Use   Smoking status: Never   Smokeless tobacco: Never  Vaping Use   Vaping Use: Never used  Substance Use Topics   Alcohol use: No    Alcohol/week: 0.0 standard drinks of alcohol   Drug use: No   Allergies   Patient has no known allergies.  Review of Systems Review of Systems Pertinent findings revealed after performing a 14 point review of systems has been noted in the history of present illness.  Physical Exam Vital Signs BP 130/85 (BP Location: Right Arm)   Pulse 96   Temp 99.9 F (37.7  C) (Oral)   Resp 20   LMP 04/12/2022 (Exact Date)   SpO2 100%   No data found.  Physical Exam Constitutional:      Appearance: She is ill-appearing.  HENT:     Head: Normocephalic and atraumatic.     Salivary Glands: Right salivary gland is not diffusely enlarged or tender. Left salivary gland is not diffusely enlarged or tender.     Right Ear: Hearing, tympanic membrane, ear canal and external ear normal.     Left Ear: Hearing, ear canal and external ear normal. A middle ear effusion is present. Tympanic membrane is injected and bulging.     Nose: Congestion and rhinorrhea present. Rhinorrhea is clear.     Right Sinus: No maxillary sinus tenderness or frontal sinus tenderness.     Left Sinus: No maxillary  sinus tenderness.     Mouth/Throat:     Lips: Pink.     Mouth: Mucous membranes are moist.     Pharynx: Uvula midline. Pharyngeal swelling, oropharyngeal exudate, posterior oropharyngeal erythema and uvula swelling present.     Comments: Tonsils are absent Cardiovascular:     Rate and Rhythm: Normal rate and regular rhythm.     Pulses: Normal pulses.  Pulmonary:     Effort: Pulmonary effort is normal. No accessory muscle usage, prolonged expiration or respiratory distress.     Breath sounds: No stridor. No wheezing, rhonchi or rales.     Comments: Turbulent breath sounds throughout without wheeze, rale, rhonchi. Abdominal:     General: Abdomen is flat. Bowel sounds are normal.     Palpations: Abdomen is soft.  Musculoskeletal:        General: Normal range of motion.  Lymphadenopathy:     Cervical: Cervical adenopathy present.     Right cervical: Superficial cervical adenopathy and posterior cervical adenopathy present.     Left cervical: Superficial cervical adenopathy and posterior cervical adenopathy present.  Skin:    General: Skin is warm and dry.  Neurological:     General: No focal deficit present.     Mental Status: She is alert and oriented to person, place, and time.     Motor: Motor function is intact.     Coordination: Coordination is intact.     Gait: Gait is intact.     Deep Tendon Reflexes: Reflexes are normal and symmetric.  Psychiatric:        Attention and Perception: Attention and perception normal.        Mood and Affect: Mood and affect normal.        Speech: Speech normal.        Behavior: Behavior normal. Behavior is cooperative.        Thought Content: Thought content normal.     Visual Acuity Right Eye Distance:   Left Eye Distance:   Bilateral Distance:    Right Eye Near:   Left Eye Near:    Bilateral Near:     UC Couse / Diagnostics / Procedures:     Radiology No results found.  Procedures Procedures (including critical care  time) EKG  Pending results:  Labs Reviewed  POC SARS CORONAVIRUS 2 AG -  ED  POCT INFLUENZA A/B  POCT RAPID STREP A (OFFICE)    Medications Ordered in UC: Medications  acetaminophen (TYLENOL) tablet 650 mg (650 mg Oral Given 05/01/22 1618)    UC Diagnoses / Final Clinical Impressions(s)   I have reviewed the triage vital signs and the nursing notes.  Pertinent  labs & imaging results that were available during my care of the patient were reviewed by me and considered in my medical decision making (see chart for details).    Final diagnoses:  Non-recurrent acute suppurative otitis media of left ear without spontaneous rupture of tympanic membrane  Acute streptococcal pharyngitis   Rapid strep test today is positive.  Patient provided with a 10-day course of cefdinir for treatment of both streptococcal pharyngitis and left otitis media.  Patient requesting prescription for Diflucan in case she develops yeast infection, prescription sent.  Conservative care recommended, return precautions advised.  Patient advised to discard toothbrush after 24 hours.  Note provided for work. Please see discharge instructions below for further details of plan of care as provided to patient. ED Prescriptions     Medication Sig Dispense Auth. Provider   cefdinir (OMNICEF) 300 MG capsule Take 1 capsule (300 mg total) by mouth 2 (two) times daily for 10 days. 20 capsule Lynden Oxford Scales, PA-C      PDMP not reviewed this encounter.  Disposition Upon Discharge:  Condition: stable for discharge home Home: take medications as prescribed; routine discharge instructions as discussed; follow up as advised.  Patient presented with an acute illness with associated systemic symptoms and significant discomfort requiring urgent management. In my opinion, this is a condition that a prudent lay person (someone who possesses an average knowledge of health and medicine) may potentially expect to result in  complications if not addressed urgently such as respiratory distress, impairment of bodily function or dysfunction of bodily organs.   Routine symptom specific, illness specific and/or disease specific instructions were discussed with the patient and/or caregiver at length.   As such, the patient has been evaluated and assessed, work-up was performed and treatment was provided in alignment with urgent care protocols and evidence based medicine.  Patient/parent/caregiver has been advised that the patient may require follow up for further testing and treatment if the symptoms continue in spite of treatment, as clinically indicated and appropriate.  If the patient was tested for COVID-19, Influenza and/or RSV, then the patient/parent/guardian was advised to isolate at home pending the results of his/her diagnostic coronavirus test and potentially longer if they're positive. I have also advised pt that if his/her COVID-19 test returns positive, it's recommended to self-isolate for at least 10 days after symptoms first appeared AND until fever-free for 24 hours without fever reducer AND other symptoms have improved or resolved. Discussed self-isolation recommendations as well as instructions for household member/close contacts as per the [facility] and [place] DHHS, and also gave patient the [facility] packet with this information.  Patient/parent/caregiver has been advised to return to the [facility] or PCP in 3-5 days if no better; to PCP or the Emergency Department if new signs and symptoms develop, or if the current signs or symptoms continue to change or worsen for further workup, evaluation and treatment as clinically indicated and appropriate  The patient will follow up with their current PCP if and as advised. If the patient does not currently have a PCP we will assist them in obtaining one.   The patient may need specialty follow up if the symptoms continue, in spite of conservative treatment and management, for further  workup, evaluation, consultation and treatment as clinically indicated and appropriate.  Patient/parent/caregiver verbalized understanding and agreement of plan as discussed.  All questions were addressed during visit.  Please see discharge instructions below for further details of plan.  Discharge Instructions:   Discharge Instructions  Your strep test today is positive.  You also have a bacterial infection in your left inner ear which may or may not be related to your strep infection.  I recommend that you begin antibiotics now for treatment.  I have sent a prescription to your pharmacy.  Please take them as prescribed.  You will begin to feel better in about 24 hours.  Please be sure that you you finish the entire 10-day course of treatment to avoid worsening infection that may require longer treatment with stronger antibiotics.   After 24 hours of taking antibiotics, you are no longer contagious.  Please discard your toothbrush as well as any other oral devices that you are currently using and replace them with new ones to avoid reinfection.   Please read below to learn more about the medications, dosages and frequencies that I recommend to help alleviate your symptoms and to get you feeling better soon:   Omnicef (cefdinir):  Please take one (1) dose twice daily for 10 days.  Carole Civil will more than adequately resolved your strep throat and capably treat the bacterial infection in your left inner ear.  Please be aware that this antibiotic can cause upset stomach; this will resolve once antibiotics are complete.  You are welcome to take a probiotic, eat yogurt, take Imodium while taking this medication.  Please avoid other systemic medications such as Maalox, Pepto-Bismol or milk of magnesia as they can interfere with the body's ability to absorb the antibiotics.   Advil, Motrin (ibuprofen): This is a good anti-inflammatory medication which addresses aches, pains and inflammation of the  upper airways that causes sinus and nasal congestion as well as in the lower airways which makes your cough feel tight and sometimes burn.  I recommend that you take between 400 to 600 mg every 6-8 hours as needed.      Chloraseptic Throat Spray: Spray 5 sprays into affected area every 2 hours, hold for 15 seconds and either swallow or spit it out.  This is a excellent numbing medication because it is a spray, you can put it right where you needed and so sucking on a lozenge and numbing your entire mouth.      Please follow-up within the next 7-10 days either with your primary care provider or urgent care if your symptoms do not resolve.  If you do not have a primary care provider, we will assist you in finding one.        Thank you for visiting urgent care today.  We appreciate the opportunity to participate in your care.       This office note has been dictated using Museum/gallery curator.  Unfortunately, this method of dictation can sometimes lead to typographical or grammatical errors.  I apologize for your inconvenience in advance if this occurs.  Please do not hesitate to reach out to me if clarification is needed.      Lynden Oxford Scales, PA-C 05/01/22 1650

## 2022-05-01 NOTE — Discharge Instructions (Signed)
Your strep test today is positive.  You also have a bacterial infection in your left inner ear which may or may not be related to your strep infection.  I recommend that you begin antibiotics now for treatment.  I have sent a prescription to your pharmacy.  Please take them as prescribed.  You will begin to feel better in about 24 hours.  Please be sure that you you finish the entire 10-day course of treatment to avoid worsening infection that may require longer treatment with stronger antibiotics.   After 24 hours of taking antibiotics, you are no longer contagious.  Please discard your toothbrush as well as any other oral devices that you are currently using and replace them with new ones to avoid reinfection.   Please read below to learn more about the medications, dosages and frequencies that I recommend to help alleviate your symptoms and to get you feeling better soon:   Omnicef (cefdinir):  Please take one (1) dose twice daily for 10 days.  Carole Civil will more than adequately resolved your strep throat and capably treat the bacterial infection in your left inner ear.  Please be aware that this antibiotic can cause upset stomach; this will resolve once antibiotics are complete.  You are welcome to take a probiotic, eat yogurt, take Imodium while taking this medication.  Please avoid other systemic medications such as Maalox, Pepto-Bismol or milk of magnesia as they can interfere with the body's ability to absorb the antibiotics.   Advil, Motrin (ibuprofen): This is a good anti-inflammatory medication which addresses aches, pains and inflammation of the upper airways that causes sinus and nasal congestion as well as in the lower airways which makes your cough feel tight and sometimes burn.  I recommend that you take between 400 to 600 mg every 6-8 hours as needed.      Chloraseptic Throat Spray: Spray 5 sprays into affected area every 2 hours, hold for 15 seconds and either swallow or spit it out.  This  is a excellent numbing medication because it is a spray, you can put it right where you needed and so sucking on a lozenge and numbing your entire mouth.      Please follow-up within the next 7-10 days either with your primary care provider or urgent care if your symptoms do not resolve.  If you do not have a primary care provider, we will assist you in finding one.        Thank you for visiting urgent care today.  We appreciate the opportunity to participate in your care.

## 2022-05-01 NOTE — ED Triage Notes (Signed)
Pt presents to Urgent Care with c/o nasal congestion and sore throat x approx one week and bilateral ear fullness and achy legs today. Negative home COVID test today.

## 2022-05-02 ENCOUNTER — Ambulatory Visit: Payer: BC Managed Care – PPO | Admitting: Family Medicine

## 2022-12-08 ENCOUNTER — Other Ambulatory Visit: Payer: Self-pay | Admitting: Internal Medicine

## 2022-12-08 DIAGNOSIS — Z1231 Encounter for screening mammogram for malignant neoplasm of breast: Secondary | ICD-10-CM

## 2022-12-18 ENCOUNTER — Ambulatory Visit: Payer: BC Managed Care – PPO | Admitting: Family Medicine

## 2022-12-18 ENCOUNTER — Encounter: Payer: Self-pay | Admitting: Family Medicine

## 2022-12-18 VITALS — BP 118/78 | HR 85 | Temp 98.3°F | Ht 62.0 in | Wt 164.6 lb

## 2022-12-18 DIAGNOSIS — J029 Acute pharyngitis, unspecified: Secondary | ICD-10-CM | POA: Diagnosis not present

## 2022-12-18 LAB — POCT RAPID STREP A (OFFICE): Rapid Strep A Screen: NEGATIVE

## 2022-12-18 NOTE — Progress Notes (Signed)
Established Patient Office Visit   Subjective:  Patient ID: Mary Martin, female    DOB: Dec 23, 1975  Age: 47 y.o. MRN: [id number]  Chief Complaint  Patient presents with   Sore Throat    Sore throat only x 2 days.     Sore Throat  Pertinent negatives include no abdominal pain, coughing, headaches or vomiting.   Encounter Diagnoses  Name Primary?   Sore throat Yes   Viral pharyngitis    Presents with a 1 to 2-day history of sore throat with mild nausea.  Has been chilled.  Denies headache, fever, cough, myalgias or arthralgias.  She is a [employer].  She does not smoke.   Review of Systems  Constitutional: Negative.   HENT:  Positive for sore throat.   Eyes:  Negative for blurred vision, discharge and redness.  Respiratory: Negative.  Negative for cough.   Cardiovascular: Negative.   Gastrointestinal:  Positive for nausea. Negative for abdominal pain and vomiting.  Genitourinary: Negative.   Musculoskeletal: Negative.  Negative for myalgias.  Skin:  Negative for rash.  Neurological:  Negative for tingling, loss of consciousness, weakness and headaches.  Endo/Heme/Allergies:  Negative for polydipsia.     Current Outpatient Medications:    buPROPion (WELLBUTRIN XL) 150 MG 24 hr tablet, Take 1 tablet by mouth daily., Disp: , Rfl:    loratadine (CLARITIN) 10 MG tablet, Take 10 mg by mouth daily., Disp: , Rfl:    norgestimate-ethinyl estradiol (ORTHO-CYCLEN,SPRINTEC,PREVIFEM) 0.25-35 MG-MCG tablet, Take 1 tablet by mouth daily., Disp: , Rfl:    fluconazole (DIFLUCAN) 150 MG tablet, Take 1 tablet on day 4 of antibiotics.  Take second tablet 3 days later. (Patient not taking: Reported on 12/18/2022), Disp: 2 tablet, Rfl: 0   Objective:     BP 118/78   Pulse 85   Temp 98.3 F (36.8 C)   Ht 5\' 2"  (1.575 m)   Wt 164 lb 9.6 oz (74.7 kg)   SpO2 97%   BMI 30.11 kg/m    Physical Exam Constitutional:      General: She is not in acute distress.     Appearance: Normal appearance. She is not ill-appearing, toxic-appearing or diaphoretic.  HENT:     Head: Normocephalic and atraumatic.     Right Ear: External ear normal.     Left Ear: External ear normal.     Mouth/Throat:     Mouth: Mucous membranes are moist.     Pharynx: Oropharynx is clear. No oropharyngeal exudate or posterior oropharyngeal erythema.     Tonsils: No tonsillar exudate or tonsillar abscesses. 0 on the right. 0 on the left.  Eyes:     General: No scleral icterus.       Right eye: No discharge.        Left eye: No discharge.     Extraocular Movements: Extraocular movements intact.     Conjunctiva/sclera: Conjunctivae normal.     Pupils: Pupils are equal, round, and reactive to light.  Neck:     Thyroid: No thyromegaly.  Cardiovascular:     Rate and Rhythm: Normal rate and regular rhythm.  Pulmonary:     Effort: Pulmonary effort is normal. No respiratory distress.     Breath sounds: Normal breath sounds.  Abdominal:     General: Bowel sounds are normal.     Tenderness: There is no abdominal tenderness. There is no guarding.  Musculoskeletal:     Cervical back: Neck supple. No rigidity or tenderness.  Lymphadenopathy:     Cervical: No cervical adenopathy.  Skin:    General: Skin is warm and dry.  Neurological:     Mental Status: She is alert and oriented to person, place, and time.  Psychiatric:        Mood and Affect: Mood normal.        Behavior: Behavior normal.      No results found for any visits on 12/18/22.    The ASCVD Risk score (Arnett DK, et al., 2019) failed to calculate for the following reasons:   Cannot find a previous HDL lab   Cannot find a previous total cholesterol lab    Assessment & Plan:   Sore throat -     POCT rapid strep A  Viral pharyngitis    Return in about 1 week (around 12/25/2022), or if symptoms worsen or fail to improve.  Information was given on sore throat.  Suggested Tylenol, ibuprofen, sore throat  lozenges.  Let me know if not improving by the end of the week.  Mliss Sax, MD

## 2022-12-21 ENCOUNTER — Encounter: Payer: Self-pay | Admitting: Family Medicine

## 2022-12-21 ENCOUNTER — Telehealth: Payer: Self-pay | Admitting: Internal Medicine

## 2022-12-21 NOTE — Telephone Encounter (Signed)
Lft VM to rtn call .  She will need an OV to f/u per last OV note. Dm/cma

## 2022-12-21 NOTE — Telephone Encounter (Signed)
Pt want to know if she can do a virtual visit today in stead of driving here or can the dr just call her in some medication

## 2022-12-22 NOTE — Telephone Encounter (Signed)
LVM for pt to call and schedule in person OV per provider

## 2023-01-11 ENCOUNTER — Telehealth: Payer: Self-pay | Admitting: Internal Medicine

## 2023-01-11 ENCOUNTER — Encounter: Payer: Self-pay | Admitting: Internal Medicine

## 2023-01-11 ENCOUNTER — Ambulatory Visit: Payer: BC Managed Care – PPO | Admitting: Internal Medicine

## 2023-01-11 VITALS — BP 122/78 | HR 95 | Temp 100.0°F | Ht 62.0 in | Wt 164.0 lb

## 2023-01-11 DIAGNOSIS — R197 Diarrhea, unspecified: Secondary | ICD-10-CM | POA: Diagnosis not present

## 2023-01-11 NOTE — Assessment & Plan Note (Signed)
Not overly sick--but did see some blood yesterday Discussed trying probiotic Is using pepto For now--avoid imodium Will check for C diff since just on antibiotic

## 2023-01-11 NOTE — Telephone Encounter (Signed)
FYI: This call has been transferred to Access Nurse. Once the result note has been entered staff can address the message at that time.  Patient called in with the following symptoms:  Red Word:abdominal pain   Please advise at [facility] [phone]  Message is routed to Provider Pool and [facility] Triage    Pt's husband, Maisie Fus, called requesting an appt for pt. Maisie Fus states the pt has been having issues with uncontrollable bowel movements & abdominal pain. 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 Scheduled pt with Dr. Alphonsus Sias for today, 10/31 @ 4pm due to pt getting off of work around [employer]. Call back # [phone]

## 2023-01-11 NOTE — Progress Notes (Signed)
   Subjective:    Patient ID: Mary Martin, female    DOB: 1975/03/31, 47 y.o.   MRN: [id number]  HPI Here due to loose stools and fecal urgency  Has been seen at urgent care on 10/12 Felt bad and diagnosed with double ear infection (she thought it was sinus) Given augmentin Started with loose stools after about a week---and went to 10 days 4 days ago---really got bad Not going a lot--but she has urgency Dark --could be from pepto (comes out with force and no consistency)  No fever No abdominal pain Noticed slight blood yesterday (mucus and blood)  Current Outpatient Medications on File Prior to Visit  Medication Sig Dispense Refill   buPROPion (WELLBUTRIN XL) 150 MG 24 hr tablet Take 1 tablet by mouth daily.     loratadine (CLARITIN) 10 MG tablet Take 10 mg by mouth daily.     norgestimate-ethinyl estradiol (ORTHO-CYCLEN,SPRINTEC,PREVIFEM) 0.25-35 MG-MCG tablet Take 1 tablet by mouth daily.     No current facility-administered medications on file prior to visit.    No Known Allergies  Past Medical History:  Diagnosis Date   Anxiety     Past Surgical History:  Procedure Laterality Date   BREAST BIOPSY Left    benign   TONSILLECTOMY      Family History  Problem Relation Age of Onset   Cancer Mother    Healthy Father    Diabetes Paternal Grandmother    Heart disease Neg Hx     Social History   Socioeconomic History   Marital status: Married    Spouse name: Not on file   Number of children: 2   Years of education: Not on file   Highest education level: Not on file  Occupational History   Occupation: Grade school teacher  Tobacco Use   Smoking status: Never   Smokeless tobacco: Never  Vaping Use   Vaping status: Never Used  Substance and Sexual Activity   Alcohol use: No    Alcohol/week: 0.0 standard drinks of alcohol   Drug use: No   Sexual activity: Not on file  Other Topics Concern   Not on file  Social History Narrative   Not on file    Social Determinants of Health   Financial Resource Strain: Not on file  Food Insecurity: Not on file  Transportation Needs: Not on file  Physical Activity: Not on file  Stress: Not on file  Social Connections: Not on file  Intimate Partner Violence: Not on file   Review of Systems No N/V Eating okay No weight loss     Objective:   Physical Exam Constitutional:      Appearance: Normal appearance.  Pulmonary:     Effort: Pulmonary effort is normal.     Breath sounds: Normal breath sounds. No wheezing or rales.  Abdominal:     General: Bowel sounds are normal. There is no distension.     Palpations: Abdomen is soft.     Tenderness: There is no abdominal tenderness. There is no guarding or rebound.  Musculoskeletal:     Cervical back: Neck supple.  Lymphadenopathy:     Cervical: No cervical adenopathy.  Neurological:     Mental Status: She is alert.            Assessment & Plan:

## 2023-01-11 NOTE — Addendum Note (Signed)
Addended by: Eual Fines on: 01/11/2023 04:56 PM   Modules accepted: Orders

## 2023-01-11 NOTE — Telephone Encounter (Signed)
I spoke with pts husband; since 01/04/23 pt has had loose stools; not watery. Pt has a since of urgency when has to have BM. Pts husband said pt going 2-4 times a day with loose BMs.pts husband denies pt having any abd pain. Pt also having a lot of gas. No N&V and no fever. Pt saw Blood x 1 on 01/10/23 but has seen no more blood in stool. Pt is at work and already has appt at 4pm on 01/11/23 with Dr Alphonsus Sias.UC & ED precautions given and pts husband voiced understanding.sending note to Dr Alphonsus Sias and Alphonsus Sias pool.

## 2023-01-11 NOTE — Addendum Note (Signed)
Addended by: Tillman Abide I on: 01/11/2023 04:57 PM   Modules accepted: Orders

## 2023-01-11 NOTE — Addendum Note (Signed)
Addended by: Eual Fines on: 01/11/2023 04:48 PM   Modules accepted: Orders

## 2023-01-11 NOTE — Telephone Encounter (Signed)
Okay--I will assess her at today's visit

## 2023-01-12 NOTE — Addendum Note (Signed)
Addended by: Alvina Chou on: 01/12/2023 07:20 AM   Modules accepted: Orders

## 2023-01-15 ENCOUNTER — Other Ambulatory Visit: Payer: Self-pay | Admitting: Internal Medicine

## 2023-01-15 LAB — C. DIFFICILE GDH AND TOXIN A/B
GDH ANTIGEN: DETECTED
MICRO NUMBER:: 15675465
SPECIMEN QUALITY:: ADEQUATE
TOXIN A AND B: DETECTED

## 2023-01-15 MED ORDER — VANCOMYCIN HCL 125 MG PO CAPS: 125.0000 mg | ORAL_CAPSULE | Freq: Four times a day (QID) | ORAL | 0 refills | Status: AC

## 2023-03-01 ENCOUNTER — Encounter: Payer: Self-pay | Admitting: Internal Medicine

## 2023-03-20 LAB — HM PAP SMEAR: HPV, high-risk: NEGATIVE

## 2023-04-09 ENCOUNTER — Ambulatory Visit
Admission: RE | Admit: 2023-04-09 | Discharge: 2023-04-09 | Disposition: A | Discharge status: Home / Self Care | Payer: 59 | Source: Ambulatory Visit | Attending: Internal Medicine | Admitting: Internal Medicine

## 2023-04-09 DIAGNOSIS — Z1231 Encounter for screening mammogram for malignant neoplasm of breast: Secondary | ICD-10-CM

## 2023-04-11 ENCOUNTER — Encounter: Payer: Self-pay | Admitting: Internal Medicine

## 2023-04-11 ENCOUNTER — Other Ambulatory Visit: Payer: Self-pay | Admitting: Internal Medicine

## 2023-04-11 DIAGNOSIS — R928 Other abnormal and inconclusive findings on diagnostic imaging of breast: Secondary | ICD-10-CM

## 2023-04-13 ENCOUNTER — Ambulatory Visit
Admission: RE | Admit: 2023-04-13 | Discharge: 2023-04-13 | Disposition: A | Discharge status: Home / Self Care | Payer: 59 | Source: Ambulatory Visit | Attending: Internal Medicine | Admitting: Internal Medicine

## 2023-04-13 DIAGNOSIS — R928 Other abnormal and inconclusive findings on diagnostic imaging of breast: Secondary | ICD-10-CM

## 2023-04-15 ENCOUNTER — Encounter: Payer: Self-pay | Admitting: Internal Medicine

## 2023-11-16 ENCOUNTER — Encounter: Admitting: General Practice

## 2024-01-18 ENCOUNTER — Ambulatory Visit

## 2024-01-18 VITALS — BP 110/80 | HR 94 | Temp 98.4°F | Ht 62.5 in | Wt 170.0 lb

## 2024-01-18 DIAGNOSIS — Z113 Encounter for screening for infections with a predominantly sexual mode of transmission: Secondary | ICD-10-CM

## 2024-01-18 DIAGNOSIS — Z131 Encounter for screening for diabetes mellitus: Secondary | ICD-10-CM

## 2024-01-18 DIAGNOSIS — E66811 Obesity, class 1: Secondary | ICD-10-CM | POA: Diagnosis not present

## 2024-01-18 DIAGNOSIS — E6609 Other obesity due to excess calories: Secondary | ICD-10-CM

## 2024-01-18 DIAGNOSIS — Z136 Encounter for screening for cardiovascular disorders: Secondary | ICD-10-CM

## 2024-01-18 DIAGNOSIS — T7840XA Allergy, unspecified, initial encounter: Secondary | ICD-10-CM | POA: Insufficient documentation

## 2024-01-18 DIAGNOSIS — F339 Major depressive disorder, recurrent, unspecified: Secondary | ICD-10-CM | POA: Diagnosis not present

## 2024-01-18 DIAGNOSIS — Z6830 Body mass index (BMI) 30.0-30.9, adult: Secondary | ICD-10-CM

## 2024-01-18 DIAGNOSIS — E669 Obesity, unspecified: Secondary | ICD-10-CM | POA: Insufficient documentation

## 2024-01-18 NOTE — Progress Notes (Signed)
 Subjective:   This visit was conducted in person. The patient gave informed consent to the use of Abridge AI technology to record the contents of the encounter as documented below.   Patient ID: Mary Martin, female    DOB: 1975/08/12, 48 y.o.   MRN: [id number]   Discussed the use of AI scribe software for clinical note transcription with the patient, who gave verbal consent to proceed.  History of Present Illness Mary Martin is a 48 year old female who presents with concerns about weight gain and its management.  She has experienced weight gain over the past several years, particularly after turning 41, with her weight increasing from 164 pounds a year ago to 170 pounds currently. 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 Despite having a [employer], she struggles with consistency in attending, having only been to the gym once since school started due to time constraints and fatigue.  Her diet is not a priority, often eating 'whatever' and not prioritizing herself. She drinks diet or zero sugar sodas, avoids sweet tea, and consumes homemade coffee with zero sugar creamer. She snacks on chips, which she identifies as a weakness, and occasionally eats healthier snacks like baby rice cakes. She has tried the [employer] incorporated in the past, which resulted in weight loss, but found it difficult to maintain.  She has a history of situational depression, managed with daily Wellbutrin, which she started several years ago after experiencing daily crying spells due to a stressful class. She continues to take it as it helps her maintain emotional balance. She also takes Claritin daily for allergies and is on hormonal birth control to regulate her menstrual cycle, which she has been on since her early twenties due to irregular periods. She takes the pill continuously but allows for a period every four months to avoid  breakthrough bleeding.  She underwent a colonoscopy recently, during which a serrated polyp was removed, and she is scheduled for a follow-up in five years. She has a family history of breast cancer, as her mother had the condition, which causes her some concern. She has had follow-up mammograms and a needle biopsy in the past due to findings on her mammograms.  No current symptoms of diarrhea, which she experienced previously due to a C. diff infection following antibiotic use, resolved with treatment. 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  She lives with her husband and two daughters, Manuelita and Lucie. Manuelita is a it sales professional, and Lucie is still in school. 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 She reports feeling tired often but sleeps well. She reports no history of high blood pressure and is not aware of having high cholesterol. No current anxiety beyond situational depression.   Review of Systems  All other systems reviewed and are negative.       No Known Allergies  Current Outpatient Medications on File Prior to Visit  Medication Sig Dispense Refill   buPROPion (WELLBUTRIN XL) 150 MG 24 hr tablet Take 1 tablet by mouth daily.     loratadine (CLARITIN) 10 MG tablet Take 10 mg by mouth daily.     norgestimate-ethinyl estradiol (ORTHO-CYCLEN,SPRINTEC,PREVIFEM) 0.25-35 MG-MCG tablet Take 1 tablet by mouth daily.     No current facility-administered medications on file  prior to visit.    BP 110/80 (BP Location: Left Arm, Patient Position: Sitting, Cuff Size: Large)   Pulse 94   Temp 98.4 F (36.9 C) (Oral)   Ht 5' 2.5 (1.588 m)   Wt 170 lb (77.1 kg)   SpO2 97%   BMI 30.60 kg/m   Objective:      Physical Exam GENERAL: Alert, cooperative, well developed, no acute  distress. Obese. HEAD: Normocephalic atraumatic. EYES: Extraocular movements intact BL, pupils round, equal and reactive to light BL, conjunctivae normal BL. EXTREMITIES: No cyanosis or edema. NEUROLOGICAL: Oriented to person, place and time, no gait abnormalities, moves all extremities without gross motor or sensory deficit.         Assessment & Plan:   Assessment & Plan Obesity, class 1 Weight gain with BMI indicating overweight. Contributing factors include hormonal birth control and lifestyle habits. Would like to continue birth control for menstrual regularity. Emphasized lifestyle changes as primary intervention. Will r/o thyroid dysfunction. Referred for weight clinic.   - Ordered fasting labs including thyroid function and cholesterol levels. - Referred to [facility] for personalized weight management plan. - Encouraged dietary changes focusing on reducing processed foods and sugars. - Advised on increasing physical activity, aiming for exercise three times a week. - Provided printout on the Mediterranean diet for dietary guidance. - Scheduled follow-up in three months to assess progress and adjust plan as needed. - Consider oral medications if unimproved.   Depression Symptoms well-controlled on Wellbutrin, no acute changes at this time. - Continue Wellbutrin 150 mg daily  General Health maintenance - Ordered fasting labs including thyroid function and cholesterol levels. -Plan for physical in a few weeks    Return in about 4 weeks (around 02/15/2024) for CPE, then in 42mo, 11/11 for fasting labs.   Michaelpaul Apo K Jaylan Hinojosa, MD  01/18/24     Contains text generated by Abridge.

## 2024-01-18 NOTE — Patient Instructions (Addendum)
 Thank you for visiting [facility] today! Here's what we talked about:  - Lab appt: Dinner no later than 8PM, no food after dinner, can drink plain water - Exercise 3 times a week - Weight management will call, if not in 2 weeks call them

## 2024-01-22 ENCOUNTER — Other Ambulatory Visit (INDEPENDENT_AMBULATORY_CARE_PROVIDER_SITE_OTHER)

## 2024-01-22 DIAGNOSIS — Z136 Encounter for screening for cardiovascular disorders: Secondary | ICD-10-CM

## 2024-01-22 DIAGNOSIS — Z6830 Body mass index (BMI) 30.0-30.9, adult: Secondary | ICD-10-CM

## 2024-01-22 DIAGNOSIS — Z131 Encounter for screening for diabetes mellitus: Secondary | ICD-10-CM

## 2024-01-22 DIAGNOSIS — E6609 Other obesity due to excess calories: Secondary | ICD-10-CM

## 2024-01-22 DIAGNOSIS — Z113 Encounter for screening for infections with a predominantly sexual mode of transmission: Secondary | ICD-10-CM

## 2024-01-22 DIAGNOSIS — E66811 Obesity, class 1: Secondary | ICD-10-CM

## 2024-01-22 LAB — LIPID PANEL
Cholesterol: 216 mg/dL — ABNORMAL HIGH (ref 0–200)
HDL: 83.1 mg/dL (ref >39.00)
LDL Cholesterol: 102 mg/dL — ABNORMAL HIGH (ref 0–99)
NonHDL: 132.69
Total CHOL/HDL Ratio: 3
Triglycerides: 154 mg/dL — ABNORMAL HIGH (ref 0.0–149.0)
VLDL: 30.8 mg/dL (ref 0.0–40.0)

## 2024-01-22 LAB — HEMOGLOBIN A1C: Hgb A1c MFr Bld: 6.1 % (ref 4.6–6.5)

## 2024-01-22 LAB — TSH: TSH: 1.83 u[IU]/mL (ref 0.35–5.50)

## 2024-01-23 ENCOUNTER — Ambulatory Visit: Payer: Self-pay

## 2024-01-23 DIAGNOSIS — R7303 Prediabetes: Secondary | ICD-10-CM

## 2024-01-23 LAB — HIV ANTIBODY (ROUTINE TESTING W REFLEX)
HIV 1&2 Ab, 4th Generation: NONREACTIVE
HIV FINAL INTERPRETATION: NEGATIVE

## 2024-01-23 LAB — HEPATITIS C ANTIBODY: Hepatitis C Ab: NONREACTIVE

## 2024-02-06 ENCOUNTER — Encounter: Payer: Self-pay | Admitting: Dietician

## 2024-02-06 ENCOUNTER — Encounter: Admitting: Dietician

## 2024-02-06 VITALS — Ht 62.0 in | Wt 170.5 lb

## 2024-02-06 DIAGNOSIS — E669 Obesity, unspecified: Secondary | ICD-10-CM

## 2024-02-06 DIAGNOSIS — E6609 Other obesity due to excess calories: Secondary | ICD-10-CM | POA: Insufficient documentation

## 2024-02-06 DIAGNOSIS — Z713 Dietary counseling and surveillance: Secondary | ICD-10-CM | POA: Diagnosis present

## 2024-02-06 DIAGNOSIS — Z6830 Body mass index (BMI) 30.0-30.9, adult: Secondary | ICD-10-CM | POA: Diagnosis not present

## 2024-02-06 DIAGNOSIS — E66811 Obesity, class 1: Secondary | ICD-10-CM | POA: Insufficient documentation

## 2024-02-06 NOTE — Progress Notes (Signed)
 Medical Nutrition Therapy  Appointment Start time:  [phone]  Appointment End time:  0930  Primary concerns today: Weight Loss, Cholesterol  Referral diagnosis: E66.811,E66.09,Z68.30 (ICD-10-CM) - Class 1 obesity due to excess calories without serious comorbidity with body mass index (BMI) of 30.0 to 30.9 in adult  Preferred learning style: No preference indicated Learning readiness: Contemplating   NUTRITION ASSESSMENT   Anthropometrics: Ht: 62 Wt: 170.5 lbs BMI: 31.18 kg/m2   Clinical Medical Hx: Obesity, Depression, Fatigue Medications: Bupropion Labs: A1c - 6.1%, Cholesterol - 216, TGL - 154, LDL - 102 Notable Signs/Symptoms: N/A  Lifestyle & Dietary Hx Pt reports desire to lose weight and be healthy, reports history of gradual weight gain since turning 41. Pt states they feel like they have prioritized others instead of themselves and their health has suffered as a result.  Pt states they like structure and regimented routines. Pt reports history of doing whole30 diet, Weight Watchers, tracking food on MyNetDiary, has had some success with weight loss but hasn't found long term success. 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 Pt states that they [facility] a [facility] [employer], but has only been once in the last few months.   Estimated daily fluid intake: 64 oz Supplements: None Sleep: Recent difficulties Stress / self-care: Varies w/ work Current average weekly physical activity: ADLs   24-Hr Dietary Recall First Meal: Rice Krispies, unsweet almond milk, 1 banana, coffee w/ zero sugar pumpkin spice creamer Snack:  Second Meal: Ham and Laughing cow cheese spread wrap (sundried tomato tortilla), small bag of chips, squeeze apple sauce Snack:  Third Meal: Caesar salad kit, quesadilla (beef and cheese), salsa and sour cream dip, Dr Nunzio ZERO Snack: coffee w/ zero sugar pumpkin spice  creamer Beverages: Water   NUTRITION DIAGNOSIS  NB-1.1 Food and nutrition-related knowledge deficit As related to obesity, HLD.  As evidenced by BMI of 31.18 kg/m2, Cholesterol of 216, LDL of 102, dietary history higher in energy dense foods and saturated fats.   NUTRITION INTERVENTION  Nutrition education (E-1) on the following topics:  Educated patient on the two components of energy balance: Energy in (calories), and energy out (activity). Explain the role of negative energy balance in weight loss. Discussed options with patient to achieve a negative energy balance and how to best control energy in and energy out to accommodate their lifestyle. Educate pt on factors that can elevate LDL cholesterol, including high dietary intake of saturated fats. Educate pt on identifying sources of saturated fats, and how to make alternative food choices to lower saturated fat intake. Educate pt on the role of soluble fiber in binding to cholesterol in the GI tract an eliminating it from the body. Educate pt on dietary sources of soluble fiber.   Handouts Provided Include  General Healthful Mediterranean Diet Nutrition Therapy  Learning Style & Readiness for Change Teaching method utilized: Visual & Auditory  Demonstrated degree of understanding via: Teach Back  Barriers to learning/adherence to lifestyle change: Busy  Goals Established by Pt Choose Carb Counter or Carb Friendly tortillas for your wraps, tacos, quesadillas. Try Siggi's or [employer] Provisions SKYR for a high protein yogurt. Have Fairlife milk in place of almond milk with your cereal. Look for Low-fat Reduced-fat, or Reduced calories on your packaged foods/meals!   MONITORING & EVALUATION Dietary intake, weekly physical activity, and weight in 2 months.  Next Steps  Patient is to follow  up with RD.

## 2024-02-06 NOTE — Patient Instructions (Addendum)
 Choose Carb Counter or Carb Friendly tortillas for your wraps, tacos, quesadillas.  Try Siggi's or Icelandic Provisions SKYR for a high protein yogurt.  Have Fairlife milk in place of almond milk with your cereal.  Look for Low-fat Reduced-fat, or Reduced calories on your packaged foods/meals!

## 2024-03-04 ENCOUNTER — Other Ambulatory Visit: Payer: Self-pay | Admitting: Obstetrics and Gynecology

## 2024-03-04 DIAGNOSIS — Z1231 Encounter for screening mammogram for malignant neoplasm of breast: Secondary | ICD-10-CM

## 2024-04-04 ENCOUNTER — Ambulatory Visit

## 2024-04-04 ENCOUNTER — Telehealth: Payer: Self-pay

## 2024-04-04 VITALS — BP 124/80 | HR 95 | Temp 98.8°F | Ht 62.5 in | Wt 170.0 lb

## 2024-04-04 DIAGNOSIS — Z6830 Body mass index (BMI) 30.0-30.9, adult: Secondary | ICD-10-CM

## 2024-04-04 DIAGNOSIS — Z0000 Encounter for general adult medical examination without abnormal findings: Secondary | ICD-10-CM | POA: Diagnosis not present

## 2024-04-04 DIAGNOSIS — G8929 Other chronic pain: Secondary | ICD-10-CM

## 2024-04-04 DIAGNOSIS — R7303 Prediabetes: Secondary | ICD-10-CM | POA: Diagnosis not present

## 2024-04-04 DIAGNOSIS — E6609 Other obesity due to excess calories: Secondary | ICD-10-CM | POA: Diagnosis not present

## 2024-04-04 DIAGNOSIS — E66811 Obesity, class 1: Secondary | ICD-10-CM

## 2024-04-04 DIAGNOSIS — F339 Major depressive disorder, recurrent, unspecified: Secondary | ICD-10-CM

## 2024-04-04 DIAGNOSIS — M25562 Pain in left knee: Secondary | ICD-10-CM

## 2024-04-04 NOTE — Patient Instructions (Addendum)
 Thank you for visiting [facility] today! Here's what we talked about: - Get tetanus vaccine from pharmacy and send us  record that it's done - Call [facility] if you do not hear from them in 2 weeks

## 2024-04-04 NOTE — Telephone Encounter (Signed)
 Pls call pt and schedule for OV for weight and prediabetes on 07/21/2024 (a few days after is fine) with lab visit 3 days before, please also schedule 1 year from now for CPE with fasting labs 1wk prior.

## 2024-04-04 NOTE — Progress Notes (Unsigned)
 "  Assessment & Plan:   This visit was conducted in person. The patient gave informed consent to the use of [employer] AI technology to record the contents of the encounter as documented below.  Assessment and Plan Assessment & Plan Left knee pain due to patellofemoral pain syndrome Intermittent anterior knee pain exacerbated by activity, likely due to patellar misalignment. Differential includes minor soft tissue injury, but unlikely. - Referred to physical therapy. - Advised to check insurance coverage for physical therapy. - Instructed to send MyChart message if not covered. - Advised to contact [facility] if not contacted within two weeks.  Obesity, class 1 Class 1 obesity with ongoing dietary and exercise efforts. Engaged with nutritionist. - Continue working with nutritionist. - Encouraged regular exercise, aiming for 3-5 times a week. - Continue tracking food intake.  Prediabetes Emphasis on lifestyle modifications. - Continue follow-up with nutritionist. - Encouraged dietary changes and regular exercise. - Scheduled follow-up appointment in May 2026 with lab work three days prior.  Depression Well-managed on current medication regimen with no mood issues. - Continue current medication regimen.      I have personally reviewed and have noted: 1. The patient's medical and social history 2. Their use of alcohol, tobacco or illicit drugs 3. Their current medications and supplements 4. The patient's functional ability including ADL's, fall risks, home safety risks and hearing or visual impairment. 5. Diet and physical activities 6. Evidence for depression or mood disorder   Colonoscopy: Done in Aug 2025 at [facility], reported polyp found 41yr repeat recommend, will await ROR Mammogram: Mammo in Jan 2025 showed R breats mass, US  confirmed: 0.6 x 0.3 x 0.7 cm benign complicated cyst/fibrocystic changes at the 12 o'clock, benign findings, 58yr repeat  recommended. She has it scheduled position of the RIGHT breast 8 cm from the nipple, corresponding to the mammographic mass. Pap: Thru GYN normal negative co-test Labs:  UTD Immunizations: Declines flu,covid Diet and Exercise: Sometimes exercising, trying to avoid mindless snacking Sleep and mood: Okay with melatonin, mood okay  Dental and vision: UTD Health counselling given: Healthy diet and exercise given BMI 30 and prediabetes  Advanced Directives Patient {does/does not:200015} have advanced directives including {BJACP:22709}. She {does/does not:200015} have a copy in the electronic medical record.   L knee pain, fell a few times last few mo, no instability, no giving, no stiff  Follow-up: No follow-ups on file.        Subjective:   Patient ID: Mary Martin, female    DOB: Feb 18, 1976  Age: 49 y.o. MRN: [id number]  Patient Care Team: Bennett Reuben POUR, MD as PCP - General (Family Medicine)   CC:  Chief Complaint  Patient presents with   Annual Exam    Left knee pops. Hurts at times. Worse going up steps and getting in car.     Mary Martin is a 49 y.o. female here for annual physical and f/u on chronic conditions, see assessment and plan for further details Acute complaint of left knee pain also addressed, see assessment and plan for further details  Discussed the use of AI scribe software for clinical note transcription with the patient, who gave verbal consent to proceed.  History of Present Illness Mary Martin is a 49 year old female who presents with left knee pain.  She has been experiencing intermittent left knee pain for the past few months, primarily located in the anterior aspect of the knee. The pain is noticeable but not severe  and occurs mainly during activities such as using stairs, getting in and out of a car, and after prolonged sitting, such as during church services. She recalls two minor falls in the fall season, one  during a band competition and another while tripping up steps, but does not remember which knee was impacted. There is no history of the knee giving out or locking. She has not sought emergency care for the knee.  She has a history of slightly elevated cholesterol and prediabetes, for which she is seeing a nutritionist. She is not currently on cholesterol medication. She tracks her food intake and is attempting to make healthier dietary choices, including avoiding mindless snacking. Exercise has been intermittent, with recent efforts to return to the gym being disrupted by illness and schedule conflicts. She reports good sleep quality, aided by melatonin, and no issues with mood while on Wellbutrin.  She underwent a colonoscopy on October 26, 2023, at [facility], where a polyp was found, and a follow-up was recommended in five years. She ensures to stay up to date with mammograms due to a family history of breast cancer, with her mother having had breast cancer. Her last mammogram in January 2025 showed benign cystic changes in the right breast, and she has had a needle biopsy in the past. She is scheduled for a repeat mammogram on April 09, 2024.  She has a history of normal Pap smears and is HPV negative. She does not currently smoke or drink alcohol. Her last tetanus vaccine was in 2016, and she is due for a booster. She has regular dental and eye check-ups, with the last eye exam within the past year.  No significant fatigue, dizziness, or symptoms suggestive of anemia. She reports frequent urination, especially after increasing water intake in the evenings, but no issues with urination otherwise. No blood in stools or symptoms that would suggest liver or kidney issues.     Pregnancy Intention Screening:  The pregnancy intention screening data noted above was reviewed. Potential methods of contraception were discussed. The patient elected to proceed with No data recorded.  Depression  Screening;    04/04/2024    4:06 PM 02/06/2024    8:09 AM 01/18/2024    4:01 PM  PHQ 2/9 Scores  PHQ - 2 Score 0 0 0     Anxiety Screening:     No data to display           ROS: Negative unless specifically indicated above in HPI.       Objective:     BP 124/80 (BP Location: Left Arm, Patient Position: Sitting, Cuff Size: Normal)   Pulse 95   Temp 98.8 F (37.1 C) (Oral)   Ht 5' 2.5 (1.588 m)   Wt 170 lb (77.1 kg)   SpO2 97%   BMI 30.60 kg/m    Physical Exam GENERAL: Alert, cooperative, well developed, no acute distress. HEAD: Normocephalic atraumatic, oral cavity normal. EYES: Extraocular movements intact bilaterally, pupils round, equal and reactive to light bilaterally, conjunctivae normal bilaterally, eyes normal. EARS: Tympanic membrane, ear canal and external ear normal bilaterally, ears normal. NOSE: No congestion or rhinorrhea, mucous membranes are moist. THROAT: No oropharyngeal exudate or posterior oropharyngeal erythema. CARDIOVASCULAR: Normal heart rate and rhythm, S1 and S2 normal without murmurs. CHEST: Clear to auscultation bilaterally, no wheezes, rhonchi, or crackles. ABDOMEN: Soft, non tender, non distended, without organomegaly, normal bowel sounds, abdomen normal. EXTREMITIES: No cyanosis or edema. NEUROLOGICAL: Oriented to person, place and time, no  gait abnormalities, moves all extremities without gross motor or sensory deficit. NECK: Thyroid  normal. MUSCULOSKELETAL: Left knee mild swelling, pain in front, no instability, no locking, no giving out, patellofemoral pain syndrome suspected.        Jann Ra K Sarahjane Matherly, MD  04/04/24    Contains text generated by [employer].    "

## 2024-04-09 ENCOUNTER — Ambulatory Visit

## 2024-04-10 ENCOUNTER — Encounter: Admitting: Dietician

## 2024-04-17 NOTE — Telephone Encounter (Signed)
 lvm for pt to call office to schedule appt.

## 2024-04-23 ENCOUNTER — Ambulatory Visit

## 2024-05-22 ENCOUNTER — Encounter: Admitting: Dietician

## 2024-07-16 ENCOUNTER — Other Ambulatory Visit

## 2024-07-21 ENCOUNTER — Ambulatory Visit

## 2025-03-30 ENCOUNTER — Other Ambulatory Visit

## 2025-04-06 ENCOUNTER — Encounter
